# Patient Record
Sex: Male | Born: 1945 | Race: White | Hispanic: No | Smoking: Former smoker
Health system: Southern US, Community
[De-identification: ages and names within clinical notes are randomized; demographics above are authoritative.]

## PROBLEM LIST (undated history)

## (undated) DIAGNOSIS — K219 Gastro-esophageal reflux disease without esophagitis: Secondary | ICD-10-CM

## (undated) DIAGNOSIS — I10 Essential (primary) hypertension: Secondary | ICD-10-CM

## (undated) DIAGNOSIS — I509 Heart failure, unspecified: Secondary | ICD-10-CM

## (undated) DIAGNOSIS — I2510 Atherosclerotic heart disease of native coronary artery without angina pectoris: Secondary | ICD-10-CM

## (undated) DIAGNOSIS — R519 Headache, unspecified: Secondary | ICD-10-CM

## (undated) DIAGNOSIS — E119 Type 2 diabetes mellitus without complications: Secondary | ICD-10-CM

## (undated) DIAGNOSIS — R51 Headache: Secondary | ICD-10-CM

## (undated) HISTORY — PX: APPENDECTOMY: SHX54

## (undated) HISTORY — PX: OTHER SURGICAL HISTORY: SHX169

## (undated) HISTORY — PX: EYE SURGERY: SHX253

## (undated) HISTORY — PX: COLONOSCOPY: SHX174

## (undated) HISTORY — PX: CORONARY ARTERY BYPASS GRAFT: SHX141

---

## 2008-11-04 ENCOUNTER — Ambulatory Visit: Payer: Self-pay | Admitting: Vascular Surgery

## 2008-11-04 ENCOUNTER — Ambulatory Visit: Payer: Self-pay | Admitting: Family Medicine

## 2008-11-09 ENCOUNTER — Inpatient Hospital Stay: Payer: Self-pay | Admitting: Surgery

## 2009-01-08 ENCOUNTER — Inpatient Hospital Stay: Payer: Self-pay | Admitting: Surgery

## 2009-04-18 ENCOUNTER — Ambulatory Visit: Payer: Self-pay | Admitting: Unknown Physician Specialty

## 2009-05-03 ENCOUNTER — Ambulatory Visit: Payer: Self-pay | Admitting: Unknown Physician Specialty

## 2009-05-10 ENCOUNTER — Ambulatory Visit: Payer: Self-pay | Admitting: Unknown Physician Specialty

## 2009-06-15 ENCOUNTER — Encounter: Payer: Self-pay | Admitting: Unknown Physician Specialty

## 2009-07-08 ENCOUNTER — Encounter: Payer: Self-pay | Admitting: Unknown Physician Specialty

## 2009-08-25 ENCOUNTER — Ambulatory Visit: Payer: Self-pay | Admitting: Urology

## 2009-09-07 ENCOUNTER — Ambulatory Visit: Payer: Self-pay | Admitting: Ophthalmology

## 2009-09-20 ENCOUNTER — Ambulatory Visit: Payer: Self-pay | Admitting: Ophthalmology

## 2009-09-23 ENCOUNTER — Ambulatory Visit: Payer: Self-pay | Admitting: Unknown Physician Specialty

## 2011-11-19 ENCOUNTER — Inpatient Hospital Stay: Payer: Self-pay | Admitting: Internal Medicine

## 2011-11-19 LAB — URINALYSIS, COMPLETE
Glucose,UR: NEGATIVE mg/dL (ref 0–75)
Ketone: NEGATIVE
Nitrite: NEGATIVE
Protein: 100
RBC,UR: 3 /HPF (ref 0–5)
Specific Gravity: 1.025 (ref 1.003–1.030)
Squamous Epithelial: <1
Transitional Epi: 1
WBC UR: 29 /HPF (ref 0–5)

## 2011-11-19 LAB — COMPREHENSIVE METABOLIC PANEL
Anion Gap: 9 (ref 7–16)
Calcium, Total: 9.1 mg/dL (ref 8.5–10.1)
Chloride: 98 mmol/L (ref 98–107)
Co2: 26 mmol/L (ref 21–32)
Creatinine: 1.39 mg/dL — ABNORMAL HIGH (ref 0.60–1.30)
EGFR (African American): >60
EGFR (Non-African Amer.): 54 — ABNORMAL LOW
Glucose: 148 mg/dL — ABNORMAL HIGH (ref 65–99)
Osmolality: 272 (ref 275–301)
Potassium: 2.9 mmol/L — ABNORMAL LOW (ref 3.5–5.1)
Sodium: 133 mmol/L — ABNORMAL LOW (ref 136–145)

## 2011-11-19 LAB — CBC
HGB: 14.3 g/dL (ref 13.0–18.0)
MCH: 30.8 pg (ref 26.0–34.0)
MCHC: 34 g/dL (ref 32.0–36.0)
MCV: 91 fL (ref 80–100)
RBC: 4.63 10*6/uL (ref 4.40–5.90)

## 2011-11-20 LAB — TROPONIN I
Troponin-I: 0.13 ng/mL — ABNORMAL HIGH
Troponin-I: 0.08 ng/mL — ABNORMAL HIGH

## 2011-11-20 LAB — BASIC METABOLIC PANEL
Anion Gap: 10 (ref 7–16)
Calcium, Total: 8.6 mg/dL (ref 8.5–10.1)
Chloride: 99 mmol/L (ref 98–107)
Co2: 27 mmol/L (ref 21–32)
Creatinine: 1.36 mg/dL — ABNORMAL HIGH (ref 0.60–1.30)
EGFR (Non-African Amer.): 56 — ABNORMAL LOW
Glucose: 147 mg/dL — ABNORMAL HIGH (ref 65–99)
Osmolality: 278 (ref 275–301)
Potassium: 3.1 mmol/L — ABNORMAL LOW (ref 3.5–5.1)
Sodium: 136 mmol/L (ref 136–145)

## 2011-11-20 LAB — CBC WITH DIFFERENTIAL/PLATELET
Basophil #: 0 10*3/uL (ref 0.0–0.1)
Eosinophil #: 0 10*3/uL (ref 0.0–0.7)
Eosinophil %: 0 %
HGB: 13.9 g/dL (ref 13.0–18.0)
Lymphocyte #: 0.4 10*3/uL — ABNORMAL LOW (ref 1.0–3.6)
Lymphocyte %: 3.9 %
MCH: 30.9 pg (ref 26.0–34.0)
MCHC: 34.2 g/dL (ref 32.0–36.0)
MCV: 90 fL (ref 80–100)
Monocyte #: 0.6 10*3/uL (ref 0.0–0.7)
Neutrophil %: 90.4 %
Platelet: 116 10*3/uL — ABNORMAL LOW (ref 150–440)
RDW: 13.7 % (ref 11.5–14.5)

## 2011-11-20 LAB — LIPID PANEL
Cholesterol: 102 mg/dL (ref 0–200)
Triglycerides: 166 mg/dL (ref 0–200)

## 2011-11-20 LAB — MAGNESIUM: Magnesium: 1.5 mg/dL — ABNORMAL LOW

## 2011-11-20 LAB — APTT
Activated PTT: 34.4 secs (ref 23.6–35.9)
Activated PTT: 72.5 secs — ABNORMAL HIGH (ref 23.6–35.9)

## 2011-11-21 LAB — BASIC METABOLIC PANEL
Anion Gap: 9 (ref 7–16)
Calcium, Total: 8.3 mg/dL — ABNORMAL LOW (ref 8.5–10.1)
Chloride: 100 mmol/L (ref 98–107)
Co2: 29 mmol/L (ref 21–32)
Creatinine: 0.95 mg/dL (ref 0.60–1.30)

## 2011-11-21 LAB — HEMOGLOBIN A1C: Hemoglobin A1C: 6.8 % — ABNORMAL HIGH (ref 4.2–6.3)

## 2011-11-21 LAB — CBC WITH DIFFERENTIAL/PLATELET
Basophil %: 0.2 %
HGB: 12.6 g/dL — ABNORMAL LOW (ref 13.0–18.0)
Lymphocyte %: 17.3 %
MCV: 91 fL (ref 80–100)
Monocyte %: 10.1 %
Neutrophil #: 4 10*3/uL (ref 1.4–6.5)
Platelet: 102 10*3/uL — ABNORMAL LOW (ref 150–440)
RBC: 4.15 10*6/uL — ABNORMAL LOW (ref 4.40–5.90)
RDW: 13.9 % (ref 11.5–14.5)
WBC: 5.5 10*3/uL (ref 3.8–10.6)

## 2011-11-21 LAB — TROPONIN I: Troponin-I: 0.06 ng/mL — ABNORMAL HIGH

## 2011-11-21 LAB — APTT: Activated PTT: 86.5 secs — ABNORMAL HIGH (ref 23.6–35.9)

## 2011-11-22 LAB — TROPONIN I: Troponin-I: <0.02 ng/mL

## 2011-11-25 LAB — CULTURE, BLOOD (SINGLE)

## 2012-06-20 ENCOUNTER — Ambulatory Visit: Payer: Self-pay | Admitting: Unknown Physician Specialty

## 2012-08-05 ENCOUNTER — Other Ambulatory Visit: Payer: Self-pay | Admitting: Unknown Physician Specialty

## 2012-08-06 LAB — CLOSTRIDIUM DIFFICILE BY PCR

## 2013-08-27 ENCOUNTER — Emergency Department: Payer: Self-pay | Admitting: Emergency Medicine

## 2013-08-27 LAB — CBC WITH DIFFERENTIAL/PLATELET
Basophil #: 0.1 10*3/uL (ref 0.0–0.1)
Eosinophil %: 2.9 %
HGB: 14.9 g/dL (ref 13.0–18.0)
Lymphocyte %: 34.7 %
Monocyte %: 9.6 %
Neutrophil #: 3.9 10*3/uL (ref 1.4–6.5)
Platelet: 170 10*3/uL (ref 150–440)
RDW: 13.5 % (ref 11.5–14.5)

## 2013-08-27 LAB — COMPREHENSIVE METABOLIC PANEL
Albumin: 3.8 g/dL (ref 3.4–5.0)
Bilirubin,Total: 0.3 mg/dL (ref 0.2–1.0)
EGFR (African American): >60
EGFR (Non-African Amer.): >60
Glucose: 129 mg/dL — ABNORMAL HIGH (ref 65–99)
Osmolality: 276 (ref 275–301)
Potassium: 3.7 mmol/L (ref 3.5–5.1)
SGPT (ALT): 77 U/L (ref 12–78)
Sodium: 137 mmol/L (ref 136–145)
Total Protein: 6.9 g/dL (ref 6.4–8.2)

## 2013-08-27 LAB — URINALYSIS, COMPLETE
Bilirubin,UR: NEGATIVE
Leukocyte Esterase: NEGATIVE
Ph: 5 (ref 4.5–8.0)
RBC,UR: NONE SEEN /HPF (ref 0–5)
Squamous Epithelial: NONE SEEN

## 2013-08-27 LAB — TROPONIN I: Troponin-I: <0.02 ng/mL

## 2013-08-27 LAB — CK TOTAL AND CKMB (NOT AT ARMC): CK, Total: 129 U/L (ref 35–232)

## 2013-11-20 ENCOUNTER — Ambulatory Visit: Payer: Self-pay | Admitting: Family Medicine

## 2013-11-20 LAB — CREATININE, SERUM
CREATININE: 0.93 mg/dL (ref 0.60–1.30)
EGFR (African American): >60
EGFR (Non-African Amer.): >60

## 2014-05-16 ENCOUNTER — Emergency Department: Payer: Self-pay | Admitting: Emergency Medicine

## 2014-05-16 LAB — BODY FLUID CELL COUNT WITH DIFFERENTIAL
BASOS ABS: 0 %
EOS PCT: 0 %
Lymphocytes: 22 %
Neutrophils: 26 %
Nucleated Cell Count: 1322 /mm3
Other Cells BF: 0 %
Other Mononuclear Cells: 52 %

## 2014-05-16 LAB — SYNOVIAL FLUID, CRYSTAL: CRYSTALS, JOINT FLUID: NONE SEEN

## 2014-05-24 ENCOUNTER — Ambulatory Visit: Payer: Self-pay | Admitting: Specialist

## 2014-05-24 DIAGNOSIS — I2510 Atherosclerotic heart disease of native coronary artery without angina pectoris: Secondary | ICD-10-CM

## 2014-05-24 LAB — BASIC METABOLIC PANEL
ANION GAP: 10 (ref 7–16)
BUN: 17 mg/dL (ref 7–18)
CALCIUM: 9.1 mg/dL (ref 8.5–10.1)
Chloride: 103 mmol/L (ref 98–107)
Co2: 27 mmol/L (ref 21–32)
Creatinine: 1.05 mg/dL (ref 0.60–1.30)
EGFR (Non-African Amer.): >60
GLUCOSE: 115 mg/dL — AB (ref 65–99)
Osmolality: 282 (ref 275–301)
Potassium: 4 mmol/L (ref 3.5–5.1)
SODIUM: 140 mmol/L (ref 136–145)

## 2014-05-24 LAB — CBC WITH DIFFERENTIAL/PLATELET
BASOS PCT: 0.4 %
Basophil #: 0 10*3/uL (ref 0.0–0.1)
EOS ABS: 0.2 10*3/uL (ref 0.0–0.7)
Eosinophil %: 1.7 %
HCT: 44.2 % (ref 40.0–52.0)
HGB: 14.6 g/dL (ref 13.0–18.0)
Lymphocyte #: 2.6 10*3/uL (ref 1.0–3.6)
Lymphocyte %: 27.7 %
MCH: 30.6 pg (ref 26.0–34.0)
MCHC: 33.1 g/dL (ref 32.0–36.0)
MCV: 92 fL (ref 80–100)
Monocyte #: 1 x10 3/mm (ref 0.2–1.0)
Monocyte %: 10.8 %
NEUTROS PCT: 59.4 %
Neutrophil #: 5.6 10*3/uL (ref 1.4–6.5)
Platelet: 216 10*3/uL (ref 150–440)
RBC: 4.78 10*6/uL (ref 4.40–5.90)
RDW: 14 % (ref 11.5–14.5)
WBC: 9.3 10*3/uL (ref 3.8–10.6)

## 2014-05-26 DIAGNOSIS — I10 Essential (primary) hypertension: Principal | ICD-10-CM

## 2014-05-26 DIAGNOSIS — E1159 Type 2 diabetes mellitus with other circulatory complications: Secondary | ICD-10-CM | POA: Insufficient documentation

## 2014-05-28 ENCOUNTER — Ambulatory Visit: Payer: Self-pay | Admitting: Specialist

## 2014-08-18 ENCOUNTER — Emergency Department: Payer: Self-pay | Admitting: Emergency Medicine

## 2014-08-18 LAB — URINALYSIS, COMPLETE
BACTERIA: NONE SEEN
Bilirubin,UR: NEGATIVE
Blood: NEGATIVE
GLUCOSE, UR: NEGATIVE mg/dL (ref 0–75)
Ketone: NEGATIVE
Leukocyte Esterase: NEGATIVE
Nitrite: NEGATIVE
PH: 5 (ref 4.5–8.0)
Protein: NEGATIVE
RBC,UR: <1 /HPF (ref 0–5)
Specific Gravity: 1.012 (ref 1.003–1.030)
Squamous Epithelial: <1

## 2014-08-18 LAB — CBC
HCT: 43 % (ref 40.0–52.0)
HGB: 14.5 g/dL (ref 13.0–18.0)
MCH: 31.1 pg (ref 26.0–34.0)
MCHC: 33.7 g/dL (ref 32.0–36.0)
MCV: 92 fL (ref 80–100)
Platelet: 199 10*3/uL (ref 150–440)
RBC: 4.65 10*6/uL (ref 4.40–5.90)
RDW: 13.6 % (ref 11.5–14.5)
WBC: 6.2 10*3/uL (ref 3.8–10.6)

## 2014-08-18 LAB — COMPREHENSIVE METABOLIC PANEL
ALBUMIN: 3.8 g/dL (ref 3.4–5.0)
ALK PHOS: 51 U/L
ALT: 45 U/L
ANION GAP: 7 (ref 7–16)
AST: 20 U/L (ref 15–37)
BILIRUBIN TOTAL: 0.5 mg/dL (ref 0.2–1.0)
BUN: 14 mg/dL (ref 7–18)
CALCIUM: 9.1 mg/dL (ref 8.5–10.1)
Chloride: 101 mmol/L (ref 98–107)
Co2: 29 mmol/L (ref 21–32)
Creatinine: 1.03 mg/dL (ref 0.60–1.30)
Glucose: 125 mg/dL — ABNORMAL HIGH (ref 65–99)
Osmolality: 276 (ref 275–301)
Potassium: 3.6 mmol/L (ref 3.5–5.1)
SODIUM: 137 mmol/L (ref 136–145)
Total Protein: 6.9 g/dL (ref 6.4–8.2)

## 2014-08-18 LAB — LIPASE, BLOOD: Lipase: 159 U/L (ref 73–393)

## 2015-01-29 NOTE — Op Note (Signed)
PATIENT NAME:  Derek Merritt, ZOLLNER MR#:  [id number] DATE OF BIRTH:  [birth year]  DATE OF PROCEDURE:  05/28/2014  PREOPERATIVE DIAGNOSES: 1.  Medial meniscus tear, right knee.  2.  Degenerative arthritis, medial compartment, right knee.   POSTOPERATIVE DIAGNOSES:  1.  Medial meniscus tear, right knee.  2.  Degenerative arthritis, medial compartment, right knee.  3.  Large loose body.  PROCEDURE PERFORMED: Arthroscopic partial medial meniscectomy.   SURGEON: Christophe Louis, M.D.   ANESTHESIA: General.   COMPLICATIONS: None.   DESCRIPTION OF PROCEDURE: After adequate induction of general anesthesia, the right lower extremity is placed in the legholder in the usual manner for arthroscopy. The joint is thoroughly prepped with alcohol and ChloraPrep and draped in standard sterile fashion. The joint is infiltrated with 10 mL of Marcaine with epinephrine. Diagnostic arthroscopy is performed. There is moderate synovial hypertrophy in the suprapatellar pouch. 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 In the intercondylar notch, the anterior cruciate ligament is normal. The lateral compartment is within normal limits. There is some fraying of the edge of the meniscus and the ArthroWand is used to cut this back to a stable rim. The pathology is confined to the medial compartment. 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 Using a combination of the full radial resector and the ArthroWand, the torn portion of the meniscus is resected back to a stable rim. Inflammatory tissue in the medial gutter is completely removed. The awl is then used to create 3 separate holes in the area of chondromalacia, in the medial femoral condyle, as a microfracture technique. There is seen to be a 1 cm in diameter loose  body which appeared and which was removed with the pituitary rongeur. The joint is thoroughly irrigated multiple times. Skin edges are closed with 4-0 nylon. The joint is infiltrated with 15 mL of Marcaine with epinephrine and 4 mg of morphine. A soft bulky dressing is applied. The patient is returned to the recovery room in satisfactory condition having tolerated the procedure quite well.  ____________________________ Lucas Mallow, MD ces:sb D: 05/28/2014 13:40:08 ET T: 05/28/2014 15:14:14 ET JOB#: [id number]  cc: Lucas Mallow, MD, <Dictator> Lucas Mallow MD ELECTRONICALLY SIGNED 06/02/2014 16:49

## 2015-01-30 NOTE — Consult Note (Signed)
Present Illness 69 year old male with known coronary disease with previous minimal myocardial infarction, hypertension, hyperlipidemia, and has had an abnormal EKG in the past.  The patient recently has done well from a cardiac standpoint without evidence of significant new shortness of breath.  He did have some significant weight gain which could cause some shortness of breath that has had recently.  He has had the new onset urinary tract infection type symptoms for which is caused him some weakness and dizziness.  This is caused him to come to the hospital.  At that time.  He does have some blood pressure issues were placed on appropriate medications including metoprolol, hydrochlorothiazide.  The patient also has had good control of his lipids, on simvastatin.  He does have some chronic lower extremity edema, possibly secondary to his significant weight gain which is currently regulated by furosemide.  Upon admission, the patient did have an EKG showing normal sinus rhythm with septal infarct, age undetermined.  In addition, he had a troponin level of 0.1 more consistent with current illness rather than acute myocardial infarction  Family history No family members with early onset cardiovascular disease  Social history Patient currently denies alcohol or tobacco use   Physical Exam:   GEN WD    HEENT pink conjunctivae    NECK No masses    RESP clear BS    CARD Irregular rate and rhythm    ABD denies tenderness  soft    LYMPH negative neck    EXTR negative cyanosis/clubbing    SKIN No rashes    NEURO cranial nerves intact    PSYCH alert   Review of Systems:   Subjective/Chief Complaint I have bladder issues    Review of Systems: All other systems were reviewed and found to be negative    Medications/Allergies Reviewed Medications/Allergies reviewed     cataract surgery: Both eyes   GERD - Esophageal Reflux:    HTN:    Carpal Tunnel:    Spinal Fusion:    CABG  III:    appendectomy:   Home Medications: Medication Instructions Status  MiraLax oral powder for reconstitution    as needed   Active  metoprolol tartrate 50 mg oral tablet 1 tab(s) orally once a day and 1/2  tablet at bedtime Active  glycopyrrolate 1 mg oral tablet 2 tab(s) orally 3 times a day  Active  aspirin 325 mg oral enteric coated tablet 1 tab(s) orally once a day  Active  Multiple Vitamins oral capsule 1 cap(s) orally once a day  Active  furosemide 40 mg oral tablet 1 tab(s) orally once a day as needed   Active  simvastatin tablet 10 mg 1 tab(s) orally once a day (at bedtime)  Active  gemfibrozil tablet 600 mg 1 tab(s) orally 2 times a day  Active  omeprazole enteric coated tablet 20 mg 1 tab(s) orally 2 times a day  Active  hydrochlorothiazide tablet 25 mg 1 tab(s) orally once a day  Active   Routine Hem:  11-Feb-13 19:43    WBC (CBC) 10.8   RBC (CBC) 4.63   Hemoglobin (CBC) 14.3   Hematocrit (CBC) 42.0   Platelet Count (CBC) 127   MCV 91   MCH 30.8   MCHC 34.0   RDW 13.3  Routine Chem:  11-Feb-13 19:43    Glucose, Serum 148   BUN 20   Creatinine (comp) 1.39   Sodium, Serum 133   Potassium, Serum 2.9   Chloride, Serum  98   CO2, Serum 26   Calcium (Total), Serum 9.1  Hepatic:  11-Feb-13 19:43    Bilirubin, Total 1.1   Alkaline Phosphatase 41   SGPT (ALT) 55   SGOT (AST) 45   Total Protein, Serum 7.3   Albumin, Serum 3.7  Routine Chem:  11-Feb-13 19:43    Osmolality (calc) 272   eGFR (African American) >60   eGFR (Non-African American) 54   Anion Gap 9  Cardiac:  11-Feb-13 19:43    Troponin I 0.07  Cardiology:  11-Feb-13 19:52    Ventricular Rate 84   Atrial Rate 84   P-R Interval 192   QRS Duration 88   QT 398   QTc 470   P Axis 11   R Axis -12   T Axis 115  Blood Glucose:  11-Feb-13 19:56    POCT Blood Glucose 163  Routine UA:  11-Feb-13 20:47    Color (UA) Amber   Clarity (UA) Cloudy   Glucose (UA) Negative   Bilirubin (UA)  Negative   Ketones (UA) Negative   Specific Gravity (UA) 1.025   Blood (UA) 3+   pH (UA) 5.0   Nitrite (UA) Negative   Leukocyte Esterase (UA) Trace   RBC (UA) 3 /HPF   WBC (UA) 29 /HPF   Bacteria (UA) TRACE   Epithelial Cells (UA) <1 /HPF   Transitional Epithelial (UA) 1 /HPF   WBC Clump (UA) PRESENT   Mucous (UA) PRESENT   Hyaline Cast (UA) 17 /LPF   Cellular Cast (UA) 7 /LPF  Routine Micro:  11-Feb-13 20:47    Specimen Source CC  Routine Hem:  12-Feb-13 03:29    WBC (CBC) 10.2   RBC (CBC) 4.50   Hemoglobin (CBC) 13.9   Hematocrit (CBC) 40.6   Platelet Count (CBC) 116   MCV 90   MCH 30.9   MCHC 34.2   RDW 13.7  Routine Chem:  12-Feb-13 03:29    Glucose, Serum 147   BUN 22   Creatinine (comp) 1.36   Sodium, Serum 136   Potassium, Serum 3.1   Chloride, Serum 99   CO2, Serum 27   Calcium (Total), Serum 8.6   Osmolality (calc) 278   eGFR (African American) >60   eGFR (Non-African American) 56   Anion Gap 10  Cardiac:  12-Feb-13 03:29    Troponin I 0.13  Routine Hem:  12-Feb-13 03:29    Neutrophil % 90.4   Lymphocyte % 3.9   Monocyte % 5.7   Eosinophil % 0.0   Basophil % 0.0   Neutrophil # 9.2   Lymphocyte # 0.4   Monocyte # 0.6   Eosinophil # 0.0   Basophil # 0.0  Routine Chem:  12-Feb-13 03:29    Magnesium, Serum 1.5   Cholesterol, Serum 102   Triglycerides, Serum 166   HDL (INHOUSE) 8   VLDL Cholesterol Calculated 33   LDL Cholesterol Calculated 61  Cardiac:  12-Feb-13 11:41    Troponin I 0.10  Routine Coag:  12-Feb-13 12:58    Activated PTT (APTT) 34.4   EKG:   EKG Interp. by me    Interpretation normal sinus rhythm with septal infarct, age undetermined    No Known Allergies:   Vital Signs/Nurse's Notes: **Vital Signs.:   12-Feb-13 00:39   Vital Signs Type Admission   Temperature Temperature (F) 99.8   Celsius 37.6   Pulse Pulse 87   Pulse source per Dinamap   Respirations Respirations 20  Systolic BP Systolic BP 257    Diastolic BP (mmHg) Diastolic BP (mmHg) 72   Mean BP 92   BP Source Dinamap   Pulse Ox % Pulse Ox % 92   Pulse Ox Activity Level  At rest   Oxygen Delivery Room Air/ 21 %     Impression 69 year old male with known coronary artery disease status post coronary artery bypass graft, hypertension, hyperlipidemia, abnormal EKG with admission for urinary tract and/or abdominal discomfort with elevated troponin and abnormal EKG, most consistent with current illness rather than acute myocardial infarction.  This is suggestive of demand ischemia    Plan 1.  Continue current outpatient medications for hypertension control, including hydrochlorothiazide and metoprolol. 2.  Continue simvastatin.  The goal LDL below 70 mg/dL   3.  Consider echocardiogram if necessary for LV systolic dysfunction, valvular heart disease from previous diagnosis of coronary artery disease and further treatment options as necessary. 4.  Further workup and treatment of recurrent urologic symptoms with procedures as necessary.  Patient is at low risk for cardiovascular complications. 5.  Ambulate and adjustments of medications as necessary   Electronic Signatures: Corey Skains (MD)  (Signed 12-Feb-13 17:03)  Authored: General Aspect/Present Illness, History and Physical Exam, Review of System, Past Medical History, Home Medications, Labs, EKG , Allergies, Vital Signs/Nurse's Notes, Impression/Plan   Last Updated: 12-Feb-13 17:03 by Corey Skains (MD)

## 2015-01-30 NOTE — H&P (Signed)
PATIENT NAME:  Derek Merritt, Derek Merritt MR#:  [id number] DATE OF BIRTH:  [birth year]  DATE OF ADMISSION:  11/19/2011  REFERRING PHYSICIAN: Francene Castle, MD     PRIMARY CARE PHYSICIAN: Fonnie Jarvis. Ilene Qua, MD   CHIEF COMPLAINT: Urinary tract infection and weakness for a couple of days.   HISTORY OF PRESENT ILLNESS: The patient is a 69 year old Caucasian male with a history of coronary artery disease, status post CABG, urinary tract infection, hypertension, presented in the ED with the above chief complaint. The patient is alert, awake, oriented and in no acute distress. The patient said he has had dysuria and urinary urgency for several days. He went to [facility] and was found to have a urinary tract infection. He was treated with Levaquin IV one dose and given a p.o. medication and was sent to home; but he feels sick and weak, so he came to the ED for further evaluation. The patient was noted to have an elevated troponin at 0.07 in the ED. Dr. Thomasene Lot admitted the patient for elevated troponin.  PAST MEDICAL HISTORY:  1. Coronary artery disease.  2. Urinary tract infection. 3. Hypertension.  PAST SURGICAL HISTORY:  1. CABG. 2. Spine fusion.   SOCIAL HISTORY: He smokes electronic cigarettes, but no alcohol drinking or illicit drugs.   FAMILY HISTORY: Hypertension.   ALLERGIES: No known drug allergies.   MEDICATIONS:  1. Aspirin 325 mg p.o. daily.  2. Lasix 40 mg p.o. once daily p.r.n.  3. Gemfibrozil 600 mg p.o. b.i.d.  4. Glycopyrrolate 1 mg, 2 tablets p.o. t.i.d.  5. HCTZ 25 mg p.o. daily.  6. Lopressor 50 mg, 1 tab once daily and 1/2 tablet at bedtime.  7. MiraLax p.r.n. for constipation.  8. Multivitamins p.o. once daily.  9. Omeprazole 20 mg p.o. b.i.d.  10. Zocor 10 mg p.o. at bedtime.   REVIEW OF SYSTEMS: CONSTITUTIONAL: The patient has a fever of 102 degrees, comes in for fatigue, weakness. EYES: No blurred vision, double vision, no glaucoma. ENT: No hearing loss, epistaxis or  postnasal drip. RESPIRATORY: No cough, sputum, shortness of breath. No hemoptysis. CARDIOVASCULAR: No chest pain, palpitations, orthopnea. No nocturnal dyspnea. No leg edema. GI: No abdominal pain, nausea, vomiting, or diarrhea. No melena or bloody stool. GU: Positive for dysuria, urinary frequency, but no incontinence or hematuria. ENDOCRINE: No polyuria, polydipsia. No heat or cold tolerance. HEMATOLOGY: No easy bruising or bleeding. SKIN: No rash or jaundice. NEUROLOGY: No syncope, loss of consciousness or seizure but has mild lightheadedness. PSYCHIATRIC: No depression or anxiety.   PHYSICAL EXAMINATION:  VITAL SIGNS: Temperature 102.7, then decreased to 99.6, blood pressure 107/53, pulse 76, respirations 23.   GENERAL: The patient is alert, awake, oriented, in no acute distress.   HEENT: Pupils are round, equal, and reactive to light and accommodation. Moist oral mucosa. Clear oropharynx.   NECK: Supple. No JVD or carotid bruit. No lymphadenopathy. No thyromegaly.   CARDIOVASCULAR: S1, S2 regular rate and rhythm. No murmurs or gallops. No tenderness. There is a surgical scar in the middle of the chest.   PULMONARY: Bilateral air entry. No wheezing. No rales.   ABDOMEN: Soft, obese. Bowel sounds are present. No organomegaly.   EXTREMITIES: No edema, clubbing, or cyanosis. No calf tenderness. Strong bilateral pedal pulses.   SKIN: No rash or jaundice.   NEUROLOGY: Alert and oriented x3.  No focal deficit. Power five out of five. Sensation intact. Deep tendon reflexes 2+.   LABORATORY, DIAGNOSTIC AND RADIOLOGICAL DATA:  Urinalysis shows WBC  29, RBC 3, nitrite negative.  WBC 10.8, hemoglobin 14.3, platelets 127.  Glucose 148, BUN 20, creatinine 1.39, sodium 133, potassium 3.9, chloride 98. Troponin level 0.07.  EKG showed normal sinus rhythm at 84 beats per minute.   IMPRESSION:  1. Elevated troponin, need to rule out ACS.  2. Coronary artery disease.  3. Urinary tract infection.   4. Renal insufficiency.  5. Hyponatremia.  6. Hypokalemia.  7. Hypertension.  8. Obesity.   PLAN OF TREATMENT:  1. The patient will be placed for observation. We will continue telemonitor, O2 by nasal cannula, follow up troponin level x2.  2. We will continue aspirin 325 mg p.o. daily, continue Lopressor and Lasix.  3. For urinary tract infection, we will give Rocephin IV and follow up with blood culture and urine culture.  4. For hyponatremia and renal insufficiency, we will give normal saline IV fluid and a follow-up BMP.  5. For hypokalemia, we will give potassium and follow-up BMP and magnesium level.  6. GI and deep vein thrombosis prophylaxis.   I discussed the patient's situation and the plan of treatment with the patient.    TIME SPENT: About 60 minutes   ____________________________ Demetrios Loll, MD qc:cbb D: 11/19/2011 23:01:42 ET T: 11/20/2011 07:20:23 ET JOB#: [id number]  cc: Demetrios Loll, MD, <Dictator> Fonnie Jarvis. Ilene Qua, MD Demetrios Loll MD ELECTRONICALLY SIGNED 11/21/2011 17:06

## 2015-01-30 NOTE — Discharge Summary (Signed)
PATIENT NAME:  Derek Merritt, Derek Merritt MR#:  [id number] DATE OF BIRTH:  1946-01-10  DATE OF ADMISSION:  11/19/2011 DATE OF DISCHARGE:  11/22/2011  PRIMARY CARE PHYSICIAN:  Dr. Domenick Gong.   CARDIOLOGY: Dr. Nehemiah Massed.   DISCHARGE DIAGNOSES: 1. Elevated troponin, likely due to supply/demand ischemia.  2. Hyponatremia/hypokalemia/hypomagnesemia, repleted and resolved.  3. Urinary tract infection, now treated. Urine culture remained negative.  4. Acute renal insufficiency, likely prerenal, improved with hydration.   SECONDARY DIAGNOSES:  1. Coronary artery disease. 2. Hypertension.   CONSULTATION: Cardiology, Dr. Nehemiah Massed.   PROCEDURE/RADIOLOGY:  1. Chest x-ray on 02/11 showed no acute cardiopulmonary disease.  2. 2-D echocardiogram on 02/13 showed low normal LV function, ejection fraction of 50%. Left ventricular hypertrophy. Mild mitral and tricuspid regurgitation.   MAJOR LABORATORY PANEL: Urinalysis on 02/11 showed WBC in clumps, trace bacteria, 29 WBCs, trace leukocyte esterase. Blood cultures x2 were negative. Urine cultures remained negative on 02/11.   HISTORY AND SHORT HOSPITAL COURSE: The patient is a 69 year old male with above-mentioned medical problems who was admitted for elevated troponin and feeling weak. He was found to have possible urinary tract infection based on urinalysis and was treated with IV Rocephin. His urine culture remained negative. His weakness was slowly improving with hydration. Considering his elevated troponin, cardiology consult was obtained with Dr. Nehemiah Massed who recommended 2-D echocardiogram. His troponin peaked at 0.13 and this was thought to be due to supply demand ischemia, possibly due to infection and/or underlying minimal renal insufficiency. The patient was slowly improving. Dr. Nehemiah Massed did not recommend any further treatment. He also had significant electrolyte disturbances including hyponatremia, hypokalemia, and hypomagnesemia, which was aggressively  repleted and resolved. He is feeling much better and is being discharged home in stable condition.   PERTINENT PHYSICAL EXAMINATION: VITAL SIGNS: On the date of discharge, his vital signs are as follows: Temperature 98, heart rate 60 per minute, respirations 20 per minute, blood pressure 146/81. He is saturating 94% on room air.  CARDIOVASCULAR: S1, S2 normal. No murmur, rubs, or gallop. LUNGS: Clear to auscultation bilaterally. No wheezing, rales, rhonchi, or crepitation. ABDOMEN: Soft, obese, benign. NEUROLOGIC: Nonfocal examination. All other physical examination remained at baseline.   DISCHARGE MEDICATIONS:  1. MiraLAX as needed.  2. Metoprolol 50 mg p.o. daily in the morning and half-tablet at bedtime.  3. Glycopyrrolate 1 mg 2 tablets p.o. 3 times daily.  4. Aspirin 325 mg p.o. daily.  5. Multivitamin 1 capsule p.o. daily.  6. Lasix 40 mg p.o. daily as needed.  7. Zocor 10 mg p.o. at bedtime. 8. Gemfibrozil 600 mg p.o. b.i.d.  9. Omeprazole 20 mg p.o. b.i.d.  10. Hydrochlorothiazide 25 mg p.o. daily.   DISCHARGE DIET: Low sodium, low cholesterol.   DISCHARGE ACTIVITY: As tolerated.   DISCHARGE INSTRUCTIONS AND FOLLOW-UP:  1. The patient was instructed to follow-up with his primary care physician, Dr. Domenick Gong, in 1 to 2 weeks.  2. He will need follow-up with Dr. Nehemiah Massed in 2 to 3 weeks.         TOTAL TIME DISCHARGING THIS PATIENT: 55 minutes.   ____________________________ Lucina Mellow. Manuella Ghazi, MD vss:ap D: 11/22/2011 10:56:30 ET             T: 11/22/2011 15:37:34 ET JOB#: [id number]  cc: Ameya Vowell S. Manuella Ghazi, MD, <Dictator> Fonnie Jarvis. Ilene Qua, MD Corey Skains, MD Remer Macho MD ELECTRONICALLY SIGNED 11/23/2011 22:41

## 2016-11-16 ENCOUNTER — Ambulatory Visit: Payer: Self-pay | Admitting: Cardiovascular Disease

## 2016-11-22 ENCOUNTER — Ambulatory Visit: Payer: Medicare Other | Admitting: Cardiovascular Disease

## 2017-03-18 ENCOUNTER — Encounter: Admission: RE | Payer: Self-pay | Source: Ambulatory Visit

## 2017-03-18 ENCOUNTER — Ambulatory Visit
Admission: RE | Admit: 2017-03-18 | Payer: Medicare Other | Source: Ambulatory Visit | Admitting: Unknown Physician Specialty

## 2017-03-18 SURGERY — ESOPHAGOGASTRODUODENOSCOPY (EGD) WITH PROPOFOL
Anesthesia: General

## 2017-04-26 ENCOUNTER — Encounter: Payer: Self-pay | Admitting: *Deleted

## 2017-04-29 ENCOUNTER — Encounter
Admission: RE | Disposition: A | Discharge status: Home / Self Care | Payer: Self-pay | Source: Ambulatory Visit | Attending: Gastroenterology

## 2017-04-29 ENCOUNTER — Ambulatory Visit
Admission: RE | Admit: 2017-04-29 | Discharge: 2017-04-29 | Disposition: A | Discharge status: Home / Self Care | Payer: Medicare Other | Source: Ambulatory Visit | Attending: Gastroenterology | Admitting: Gastroenterology

## 2017-04-29 ENCOUNTER — Encounter: Payer: Self-pay | Admitting: *Deleted

## 2017-04-29 ENCOUNTER — Ambulatory Visit: Payer: Medicare Other | Admitting: Anesthesiology

## 2017-04-29 DIAGNOSIS — Z1211 Encounter for screening for malignant neoplasm of colon: Secondary | ICD-10-CM | POA: Diagnosis not present

## 2017-04-29 DIAGNOSIS — Z888 Allergy status to other drugs, medicaments and biological substances status: Secondary | ICD-10-CM | POA: Insufficient documentation

## 2017-04-29 DIAGNOSIS — I5032 Chronic diastolic (congestive) heart failure: Secondary | ICD-10-CM | POA: Diagnosis not present

## 2017-04-29 DIAGNOSIS — I2510 Atherosclerotic heart disease of native coronary artery without angina pectoris: Secondary | ICD-10-CM | POA: Insufficient documentation

## 2017-04-29 DIAGNOSIS — K5730 Diverticulosis of large intestine without perforation or abscess without bleeding: Secondary | ICD-10-CM | POA: Insufficient documentation

## 2017-04-29 DIAGNOSIS — D123 Benign neoplasm of transverse colon: Secondary | ICD-10-CM | POA: Insufficient documentation

## 2017-04-29 DIAGNOSIS — Z79899 Other long term (current) drug therapy: Secondary | ICD-10-CM | POA: Diagnosis not present

## 2017-04-29 DIAGNOSIS — Z96652 Presence of left artificial knee joint: Secondary | ICD-10-CM | POA: Diagnosis not present

## 2017-04-29 DIAGNOSIS — Z7982 Long term (current) use of aspirin: Secondary | ICD-10-CM | POA: Diagnosis not present

## 2017-04-29 DIAGNOSIS — K2960 Other gastritis without bleeding: Secondary | ICD-10-CM | POA: Insufficient documentation

## 2017-04-29 DIAGNOSIS — E669 Obesity, unspecified: Secondary | ICD-10-CM | POA: Diagnosis not present

## 2017-04-29 DIAGNOSIS — Z7984 Long term (current) use of oral hypoglycemic drugs: Secondary | ICD-10-CM | POA: Diagnosis not present

## 2017-04-29 DIAGNOSIS — Z951 Presence of aortocoronary bypass graft: Secondary | ICD-10-CM | POA: Diagnosis not present

## 2017-04-29 DIAGNOSIS — D120 Benign neoplasm of cecum: Secondary | ICD-10-CM | POA: Diagnosis not present

## 2017-04-29 DIAGNOSIS — Z86010 Personal history of colonic polyps: Secondary | ICD-10-CM | POA: Insufficient documentation

## 2017-04-29 DIAGNOSIS — K640 First degree hemorrhoids: Secondary | ICD-10-CM | POA: Diagnosis not present

## 2017-04-29 DIAGNOSIS — K219 Gastro-esophageal reflux disease without esophagitis: Secondary | ICD-10-CM | POA: Diagnosis not present

## 2017-04-29 DIAGNOSIS — I110 Hypertensive heart disease with heart failure: Secondary | ICD-10-CM | POA: Insufficient documentation

## 2017-04-29 DIAGNOSIS — R51 Headache: Secondary | ICD-10-CM | POA: Diagnosis not present

## 2017-04-29 DIAGNOSIS — K317 Polyp of stomach and duodenum: Secondary | ICD-10-CM | POA: Diagnosis not present

## 2017-04-29 HISTORY — DX: Atherosclerotic heart disease of native coronary artery without angina pectoris: I25.10

## 2017-04-29 HISTORY — DX: Headache: R51

## 2017-04-29 HISTORY — PX: ESOPHAGOGASTRODUODENOSCOPY (EGD) WITH PROPOFOL: SHX5813

## 2017-04-29 HISTORY — DX: Gastro-esophageal reflux disease without esophagitis: K21.9

## 2017-04-29 HISTORY — DX: Essential (primary) hypertension: I10

## 2017-04-29 HISTORY — PX: COLONOSCOPY WITH PROPOFOL: SHX5780

## 2017-04-29 HISTORY — DX: Headache, unspecified: R51.9

## 2017-04-29 LAB — GLUCOSE, CAPILLARY: GLUCOSE-CAPILLARY: 125 mg/dL — AB (ref 65–99)

## 2017-04-29 SURGERY — COLONOSCOPY WITH PROPOFOL
Anesthesia: General

## 2017-04-29 MED ORDER — PROPOFOL 10 MG/ML IV BOLUS
INTRAVENOUS | Status: DC | PRN
  Administered 2017-04-29: 20 mg via INTRAVENOUS
  Administered 2017-04-29: 10 mg via INTRAVENOUS
  Administered 2017-04-29: 20 mg via INTRAVENOUS
  Administered 2017-04-29: 50 mg via INTRAVENOUS
  Administered 2017-04-29: 20 mg via INTRAVENOUS
  Administered 2017-04-29: 10 mg via INTRAVENOUS

## 2017-04-29 MED ORDER — FENTANYL CITRATE (PF) 100 MCG/2ML IJ SOLN
INTRAMUSCULAR | Status: AC
  Filled 2017-04-29: qty 2

## 2017-04-29 MED ORDER — EPHEDRINE SULFATE 50 MG/ML IJ SOLN
INTRAMUSCULAR | Status: AC
  Filled 2017-04-29: qty 1

## 2017-04-29 MED ORDER — SODIUM CHLORIDE 0.9 % IV SOLN: INTRAVENOUS | Status: DC

## 2017-04-29 MED ORDER — FENTANYL CITRATE (PF) 100 MCG/2ML IJ SOLN
INTRAMUSCULAR | Status: DC | PRN
  Administered 2017-04-29: 50 ug via INTRAVENOUS

## 2017-04-29 MED ORDER — SODIUM CHLORIDE 0.9 % IV SOLN
INTRAVENOUS | Status: AC
  Administered 2017-04-29: 2 g via INTRAVENOUS
  Filled 2017-04-29: qty 2000

## 2017-04-29 MED ORDER — MIDAZOLAM HCL 2 MG/2ML IJ SOLN
INTRAMUSCULAR | Status: DC | PRN
  Administered 2017-04-29: 1 mg via INTRAVENOUS

## 2017-04-29 MED ORDER — LIDOCAINE HCL (PF) 2 % IJ SOLN
INTRAMUSCULAR | Status: AC
  Filled 2017-04-29: qty 2

## 2017-04-29 MED ORDER — GLYCOPYRROLATE 0.2 MG/ML IJ SOLN
INTRAMUSCULAR | Status: DC | PRN
  Administered 2017-04-29: 0.2 mg via INTRAVENOUS

## 2017-04-29 MED ORDER — AMPICILLIN SODIUM 2 G IJ SOLR
2.0000 g | Freq: Once | INTRAMUSCULAR | Status: AC
  Administered 2017-04-29: 2 g via INTRAVENOUS

## 2017-04-29 MED ORDER — PROPOFOL 500 MG/50ML IV EMUL
INTRAVENOUS | Status: DC | PRN
  Administered 2017-04-29: 150 ug/kg/min via INTRAVENOUS

## 2017-04-29 MED ORDER — PROPOFOL 500 MG/50ML IV EMUL
INTRAVENOUS | Status: AC
  Filled 2017-04-29: qty 50

## 2017-04-29 MED ORDER — SODIUM CHLORIDE 0.9 % IV SOLN
INTRAVENOUS | Status: DC
  Administered 2017-04-29 (×2): via INTRAVENOUS

## 2017-04-29 MED ORDER — MIDAZOLAM HCL 2 MG/2ML IJ SOLN
INTRAMUSCULAR | Status: AC
  Filled 2017-04-29: qty 2

## 2017-04-29 MED ORDER — EPHEDRINE SULFATE 50 MG/ML IJ SOLN
INTRAMUSCULAR | Status: DC | PRN
  Administered 2017-04-29: 10 mg via INTRAVENOUS

## 2017-04-29 NOTE — Anesthesia Postprocedure Evaluation (Signed)
Anesthesia Post Note  Patient: STEFFON GLADU  Procedure(s) Performed: Procedure(s) (LRB): COLONOSCOPY WITH PROPOFOL (N/A) ESOPHAGOGASTRODUODENOSCOPY (EGD) WITH PROPOFOL (N/A)  Patient location during evaluation: Endoscopy Anesthesia Type: General Level of consciousness: awake and alert and oriented Pain management: pain level controlled Vital Signs Assessment: post-procedure vital signs reviewed and stable Respiratory status: spontaneous breathing, nonlabored ventilation and respiratory function stable Cardiovascular status: blood pressure returned to baseline and stable Postop Assessment: no signs of nausea or vomiting Anesthetic complications: no     Last Vitals:  Vitals:   04/29/17 1535 04/29/17 1555  BP: 131/73 127/80  Pulse: 64   Resp: 20   Temp:      Last Pain:  Vitals:   04/29/17 1525  TempSrc: Tympanic  PainSc: 4                  Moe Brier

## 2017-04-29 NOTE — Transfer of Care (Signed)
Immediate Anesthesia Transfer of Care Note  Patient: Derek Merritt  Procedure(s) Performed: Procedure(s): COLONOSCOPY WITH PROPOFOL (N/A) ESOPHAGOGASTRODUODENOSCOPY (EGD) WITH PROPOFOL (N/A)  Patient Location: PACU  Anesthesia Type:General  Level of Consciousness: awake  Airway & Oxygen Therapy: Patient Spontanous Breathing and Patient connected to nasal cannula oxygen  Post-op Assessment: Report given to RN and Post -op Vital signs reviewed and stable  Post vital signs: Reviewed and stable  Last Vitals:  Vitals:   04/29/17 1334 04/29/17 1525  BP: (!) 156/72 126/68  Pulse: 61 70  Resp: 20 (!) 23  Temp: (!) 36.2 C (!) 35.6 C    Last Pain:  Vitals:   04/29/17 1525  TempSrc: Tympanic  PainSc: 4          Complications: No apparent anesthesia complications

## 2017-04-29 NOTE — Anesthesia Preprocedure Evaluation (Signed)
Anesthesia Evaluation  Patient identified by MRN, date of birth, ID band Patient awake    Reviewed: Allergy & Precautions, NPO status , Patient's Chart, lab work & pertinent test results  History of Anesthesia Complications Negative for: history of anesthetic complications  Airway Mallampati: III  TM Distance: >3 FB Neck ROM: Full    Dental  (+) Poor Dentition   Pulmonary neg sleep apnea, neg COPD, former smoker,    breath sounds clear to auscultation- rhonchi (-) wheezing      Cardiovascular hypertension, Pt. on medications + CAD and + CABG (2001)  (-) Cardiac Stents  Rhythm:Regular Rate:Normal - Systolic murmurs and - Diastolic murmurs    Neuro/Psych  Headaches, negative psych ROS   GI/Hepatic Neg liver ROS, GERD  ,  Endo/Other  negative endocrine ROSneg diabetes  Renal/GU negative Renal ROS     Musculoskeletal negative musculoskeletal ROS (+)   Abdominal (+) + obese,   Peds  Hematology negative hematology ROS (+)   Anesthesia Other Findings Past Medical History: No date: Coronary artery disease No date: GERD (gastroesophageal reflux disease) No date: Headache     Comment:  chronic No date: Hypertension     Comment:  accelerated hypertension with diastolic congestive heart              failure   Reproductive/Obstetrics                             Anesthesia Physical Anesthesia Plan  ASA: III  Anesthesia Plan: General   Post-op Pain Management:    Induction: Intravenous  PONV Risk Score and Plan: 1 and Propofol  Airway Management Planned: Natural Airway  Additional Equipment:   Intra-op Plan:   Post-operative Plan:   Informed Consent: I have reviewed the patients History and Physical, chart, labs and discussed the procedure including the risks, benefits and alternatives for the proposed anesthesia with the patient or authorized representative who has indicated his/her  understanding and acceptance.   Dental advisory given  Plan Discussed with: CRNA and Anesthesiologist  Anesthesia Plan Comments:         Anesthesia Quick Evaluation

## 2017-04-29 NOTE — Op Note (Signed)
[facility] Gastroenterology Patient Name: Derek Merritt Procedure Date: 04/29/2017 1:52 PM MRN: [id number] Account #: [id number] Date of Birth: July 21, 1946 Admit Type: Outpatient Age: 71 Room: [facility] ENDO ROOM 1 Gender: Male Note Status: Finalized Procedure:            Upper GI endoscopy Indications:          Dyspepsia, Gastro-esophageal reflux disease Providers:            Lollie Sails, MD Referring MD:         Claudie Leach Medicines:            Monitored Anesthesia Care Complications:        No immediate complications. Procedure:            Pre-Anesthesia Assessment:                       - ASA Grade Assessment: III - A patient with severe                        systemic disease.                       After obtaining informed consent, the endoscope was                        passed under direct vision. Throughout the procedure,                        the patient's blood pressure, pulse, and oxygen                        saturations were monitored continuously. The Endoscope                        was introduced through the mouth, and advanced to the                        third part of duodenum. The upper GI endoscopy was                        accomplished without difficulty. The patient tolerated                        the procedure well. Findings:      The Z-line was variable. Biopsies were taken with a cold forceps for       histology.      The exam of the esophagus was otherwise normal.      A single 4 mm semi-sessile polyp with no bleeding and no stigmata of       recent bleeding was found in the cardia. Biopsies were taken with a cold       forceps for histology.      Patchy mild inflammation characterized by congestion (edema), erosions       and erythema was found in the gastric antrum. Biopsies were taken with a       cold forceps for histology.      The cardia and gastric fundus were normal on retroflexion otherwise.      The examined  duodenum was normal. Impression:           - Z-line variable. Biopsied.                       -  A single gastric polyp. Biopsied.                       - Erosive gastritis. Biopsied.                       - Normal examined duodenum. Recommendation:       - Use Protonix (pantoprazole) 40 mg PO BID for 4 weeks.                       - Use Protonix (pantoprazole) 40 mg PO daily daily. Procedure Code(s):    --- Professional ---                       [phone], Esophagogastroduodenoscopy, flexible, transoral;                        with biopsy, single or multiple Diagnosis Code(s):    --- Professional ---                       K22.8, Other specified diseases of esophagus                       K31.7, Polyp of stomach and duodenum                       K29.60, Other gastritis without bleeding                       R10.13, Epigastric pain                       K21.9, Gastro-esophageal reflux disease without                        esophagitis CPT copyright 2016 American Medical Association. All rights reserved. The codes documented in this report are preliminary and upon coder review may  be revised to meet current compliance requirements. Lollie Sails, MD 04/29/2017 2:48:05 PM This report has been signed electronically. Number of Addenda: 0 Note Initiated On: 04/29/2017 1:52 PM      [facility]

## 2017-04-29 NOTE — Anesthesia Procedure Notes (Signed)
Date/Time: 04/29/2017 2:20 PM Performed by: Allean Found Pre-anesthesia Checklist: Patient identified, Emergency Drugs available, Suction available, Patient being monitored and Timeout performed Patient Re-evaluated:Patient Re-evaluated prior to induction Oxygen Delivery Method: Nasal cannula Placement Confirmation: positive ETCO2

## 2017-04-29 NOTE — Anesthesia Post-op Follow-up Note (Cosign Needed)
Anesthesia QCDR form completed.        

## 2017-04-29 NOTE — Op Note (Signed)
[facility] Gastroenterology Patient Name: Derek Merritt Procedure Date: 04/29/2017 1:52 PM MRN: [id number] Account #: [id number] Date of Birth: 1946/02/13 Admit Type: Outpatient Age: 71 Room: [facility] ENDO ROOM 1 Gender: Male Note Status: Finalized Procedure:            Colonoscopy Indications:          Personal history of colonic polyps Providers:            Lollie Sails, MD Referring MD:         Claudie Leach Medicines:            Monitored Anesthesia Care Complications:        No immediate complications. Procedure:            Pre-Anesthesia Assessment:                       - ASA Grade Assessment: III - A patient with severe                        systemic disease.                       After obtaining informed consent, the colonoscope was                        passed under direct vision. Throughout the procedure,                        the patient's blood pressure, pulse, and oxygen                        saturations were monitored continuously. The                        Colonoscope was introduced through the anus and                        advanced to the the cecum, identified by appendiceal                        orifice and ileocecal valve. The colonoscopy was                        performed with moderate difficulty due to significant                        looping. Successful completion of the procedure was                        aided by using manual pressure. The quality of the                        bowel preparation was fair. Findings:      A 1 mm polyp was found in the cecum. The polyp was semi-pedunculated.       The polyp was removed with a cold biopsy forceps. Resection and       retrieval were complete.      A 2 mm polyp was found in the transverse colon. The polyp was sessile.       The polyp was removed with a cold biopsy forceps. Resection and  retrieval were complete.      Multiple small-mouthed diverticula were found in the  sigmoid colon and       descending colon.      The retroflexed view of the distal rectum and anal verge was normal and       showed no anal or rectal abnormalities.      The digital rectal exam was normal.      Non-bleeding internal hemorrhoids were found during retroflexion and       during anoscopy. The hemorrhoids were small and Grade I (internal       hemorrhoids that do not prolapse). Impression:           - Preparation of the colon was fair.                       - One 1 mm polyp in the cecum, removed with a cold                        biopsy forceps. Resected and retrieved.                       - One 2 mm polyp in the transverse colon, removed with                        a cold biopsy forceps. Resected and retrieved.                       - Diverticulosis in the sigmoid colon and in the                        descending colon.                       - The distal rectum and anal verge are normal on                        retroflexion view. Recommendation:       - Discharge patient to home.                       - Telephone GI clinic for pathology results in 1 week.                       - Return to GI clinic in 4 weeks. Procedure Code(s):    --- Professional ---                       [phone], Colonoscopy, flexible; with biopsy, single or                        multiple Diagnosis Code(s):    --- Professional ---                       D12.0, Benign neoplasm of cecum                       D12.3, Benign neoplasm of transverse colon (hepatic                        flexure or splenic flexure)  Z86.010, Personal history of colonic polyps                       K57.30, Diverticulosis of large intestine without                        perforation or abscess without bleeding CPT copyright 2016 American Medical Association. All rights reserved. The codes documented in this report are preliminary and upon coder review may  be revised to meet current compliance  requirements. Lollie Sails, MD 04/29/2017 3:17:24 PM This report has been signed electronically. Number of Addenda: 0 Note Initiated On: 04/29/2017 1:52 PM Scope Withdrawal Time: 0 hours 6 minutes 41 seconds  Total Procedure Duration: 0 hours 20 minutes 31 seconds       [facility]

## 2017-04-29 NOTE — H&P (Signed)
Outpatient short stay form Pre-procedure 04/29/2017 2:02 PM Lollie Sails MD  Primary Physician:  Raliegh Ip. Johnanna Schneiders, NP  Reason for visit:  EGD and colonoscopy  History of present illness:  Patient is a 71 year old male presenting today as above. He is been having several months of increased symptoms of eructation as well as left-sided abdominal pain. This is in the left upper quadrant as well as seemingly in the left lower quadrant as well. He does have a personal history of adenomatous colon polyps. He is been having increasing reflux symptoms. He does intermittently take diclofenac and was previously taking omeprazole suboptimally however states he is currently taking twice a day. Continues to have the symptoms. He does take a 325 mg aspirin daily but has held that for about 5 days. He did have a knee replacement with hardware left in the knee. This was in 2017. We will need to use a antibiotic prophylaxis. He is not allergic penicillin.    Current Facility-Administered Medications:  .  0.9 %  sodium chloride infusion, , Intravenous, Continuous, Lollie Sails, MD .  0.9 %  sodium chloride infusion, , Intravenous, Continuous, Lollie Sails, MD .  ampicillin (OMNIPEN) 2 g in sodium chloride 0.9 % 50 mL IVPB, 2 g, Intravenous, Once, Lollie Sails, MD  Prescriptions Prior to Admission  Medication Sig Dispense Refill Last Dose  . aspirin 325 MG tablet Take 325 mg by mouth daily.   Past Week at Unknown time  . diclofenac (VOLTAREN) 75 MG EC tablet Take 75 mg by mouth 2 (two) times daily.   Past Month at Unknown time  . fenofibrate 54 MG tablet Take 54 mg by mouth daily.   Past Week at Unknown time  . furosemide (LASIX) 40 MG tablet Take 40 mg by mouth daily as needed.   Past Week at Unknown time  . hydrochlorothiazide (HYDRODIURIL) 25 MG tablet Take 25 mg by mouth daily.   04/29/2017 at 1045  . losartan (COZAAR) 50 MG tablet Take 50 mg by mouth daily.   Past Week at Unknown time  .  metFORMIN (GLUCOPHAGE) 500 MG tablet Take by mouth 2 (two) times daily with a meal.   Past Week at Unknown time  . metoprolol tartrate (LOPRESSOR) 50 MG tablet Take 37.5 mg by mouth 1 day or 1 dose.   04/29/2017 at 1045  . Multiple Vitamin (MULTIVITAMIN) tablet Take 1 tablet by mouth daily.   Past Week at Unknown time  . omeprazole (PRILOSEC) 20 MG capsule Take 20 mg by mouth daily.   04/29/2017 at 1045  . simvastatin (ZOCOR) 20 MG tablet Take 20 mg by mouth daily.   04/28/2017 at Unknown time  . fluticasone (FLONASE) 50 MCG/ACT nasal spray Place 2 sprays into both nostrils daily.   Not Taking at Unknown time  . neomycin-polymyxin-hydrocortisone (CORTISPORIN) OTIC solution 4 (four) times daily.   Not Taking at Unknown time     Allergies  Allergen Reactions  . Lisinopril      Past Medical History:  Diagnosis Date  . Coronary artery disease   . GERD (gastroesophageal reflux disease)   . Headache    chronic  . Hypertension    accelerated hypertension with diastolic congestive heart failure    Review of systems:      Physical Exam    Heart and lungs: Regular rate and rhythm without rub or gallop, lungs are bilaterally clear.    HEENT: Normocephalic atraumatic eyes are anicteric    Other:  Pertinant exam for procedure: Soft nontender nondistended bowel sounds positive normoactive, obese.    Planned proceedures: EGD, colonoscopy and indicated procedures. I have discussed the risks benefits and complications of procedures to include not limited to bleeding, infection, perforation and the risk of sedation and the patient wishes to proceed.    Lollie Sails, MD Gastroenterology 04/29/2017  2:02 PM

## 2017-04-30 ENCOUNTER — Encounter: Payer: Self-pay | Admitting: Gastroenterology

## 2017-05-01 LAB — SURGICAL PATHOLOGY

## 2017-07-03 ENCOUNTER — Ambulatory Visit
Admission: RE | Admit: 2017-07-03 | Discharge: 2017-07-03 | Disposition: A | Discharge status: Home / Self Care | Payer: Medicare Other | Source: Ambulatory Visit | Attending: Family Medicine | Admitting: Family Medicine

## 2017-07-03 ENCOUNTER — Other Ambulatory Visit (HOSPITAL_COMMUNITY): Payer: Self-pay | Admitting: Family Medicine

## 2017-07-03 ENCOUNTER — Other Ambulatory Visit: Payer: Self-pay | Admitting: Family Medicine

## 2017-07-03 DIAGNOSIS — R109 Unspecified abdominal pain: Secondary | ICD-10-CM | POA: Diagnosis not present

## 2017-07-03 DIAGNOSIS — K8020 Calculus of gallbladder without cholecystitis without obstruction: Secondary | ICD-10-CM | POA: Diagnosis not present

## 2018-06-25 ENCOUNTER — Other Ambulatory Visit: Payer: Self-pay | Admitting: Nurse Practitioner

## 2018-06-25 DIAGNOSIS — R1012 Left upper quadrant pain: Secondary | ICD-10-CM

## 2018-07-05 ENCOUNTER — Ambulatory Visit
Admission: RE | Admit: 2018-07-05 | Discharge: 2018-07-05 | Disposition: A | Discharge status: Home / Self Care | Payer: Medicare Other | Source: Ambulatory Visit | Attending: Nurse Practitioner | Admitting: Nurse Practitioner

## 2018-07-05 DIAGNOSIS — R1012 Left upper quadrant pain: Secondary | ICD-10-CM | POA: Insufficient documentation

## 2018-07-05 MED ORDER — TECHNETIUM TC 99M SULFUR COLLOID
2.2800 | Freq: Once | INTRAVENOUS | Status: AC | PRN
  Administered 2018-07-05: 2.28 via INTRAVENOUS

## 2018-07-09 ENCOUNTER — Other Ambulatory Visit: Payer: Self-pay | Admitting: Nurse Practitioner

## 2018-07-09 DIAGNOSIS — Z8719 Personal history of other diseases of the digestive system: Secondary | ICD-10-CM

## 2018-07-09 DIAGNOSIS — R1012 Left upper quadrant pain: Secondary | ICD-10-CM

## 2018-07-15 ENCOUNTER — Ambulatory Visit
Admission: RE | Admit: 2018-07-15 | Discharge: 2018-07-15 | Disposition: A | Discharge status: Home / Self Care | Payer: Medicare Other | Source: Ambulatory Visit | Attending: Nurse Practitioner | Admitting: Nurse Practitioner

## 2018-07-15 DIAGNOSIS — N281 Cyst of kidney, acquired: Secondary | ICD-10-CM | POA: Insufficient documentation

## 2018-07-15 DIAGNOSIS — K760 Fatty (change of) liver, not elsewhere classified: Secondary | ICD-10-CM | POA: Insufficient documentation

## 2018-07-15 DIAGNOSIS — I700 Atherosclerosis of aorta: Secondary | ICD-10-CM | POA: Diagnosis not present

## 2018-07-15 DIAGNOSIS — Z8719 Personal history of other diseases of the digestive system: Secondary | ICD-10-CM | POA: Diagnosis not present

## 2018-07-15 DIAGNOSIS — R1012 Left upper quadrant pain: Secondary | ICD-10-CM

## 2018-07-15 DIAGNOSIS — K8020 Calculus of gallbladder without cholecystitis without obstruction: Secondary | ICD-10-CM | POA: Insufficient documentation

## 2018-07-29 ENCOUNTER — Ambulatory Visit: Payer: Medicare Other | Admitting: Student in an Organized Health Care Education/Training Program

## 2018-08-26 ENCOUNTER — Ambulatory Visit (INDEPENDENT_AMBULATORY_CARE_PROVIDER_SITE_OTHER): Payer: Medicare Other | Admitting: Nurse Practitioner

## 2018-08-26 ENCOUNTER — Encounter (INDEPENDENT_AMBULATORY_CARE_PROVIDER_SITE_OTHER): Payer: Self-pay | Admitting: Nurse Practitioner

## 2018-08-26 VITALS — BP 116/68 | HR 67 | Resp 16 | Ht 74.0 in | Wt 385.0 lb

## 2018-08-26 DIAGNOSIS — R600 Localized edema: Secondary | ICD-10-CM

## 2018-08-26 DIAGNOSIS — I420 Dilated cardiomyopathy: Secondary | ICD-10-CM | POA: Insufficient documentation

## 2018-08-26 DIAGNOSIS — I255 Ischemic cardiomyopathy: Secondary | ICD-10-CM

## 2018-08-26 DIAGNOSIS — I152 Hypertension secondary to endocrine disorders: Secondary | ICD-10-CM

## 2018-08-26 DIAGNOSIS — I10 Essential (primary) hypertension: Secondary | ICD-10-CM | POA: Diagnosis not present

## 2018-08-26 DIAGNOSIS — E1159 Type 2 diabetes mellitus with other circulatory complications: Secondary | ICD-10-CM

## 2018-08-26 NOTE — Progress Notes (Signed)
Subjective:    Patient ID: Derek Merritt, male    DOB: 04/05/1946, 72 y.o.   MRN: [id number] Chief Complaint  Patient presents with  . New Patient (Initial Visit)    ref Dorthula Perfect for lymphedema    HPI  Derek Merritt is a 72 y.o. male that referred by Dr. Raechel Ache.  The patient has concerns of lower extremity leg swelling. The patient first noticed the swelling remotely but is now concerned because of a significant increase in the overall edema. The swelling is associated with pain and discoloration. The patient also has a history of neuropathy. The patient notes that in the morning the legs are significantly improved but they steadily worsened throughout the course of the day. Elevation makes the legs better, dependency makes them much worse.   There is no history of ulcerations associated with the swelling.   The patient denies any recent changes in their medications.  The patient has not been wearing graduated compression.  The patient has no had any past angiography.  The patient has had a triple bypass surgery, with usage of his right saphenous vein.   The patient denies a history of DVT or PE. There is no prior history of phlebitis. There is no history of primary lymphedema.  There is no history of radiation treatment to the groin or pelvis No history of malignancies. No history of trauma or groin or pelvic surgery. No history of foreign travel or parasitic infections area   The patient does endorse having a history of CHF.  He states that he has not gained any weight recently, as he weighs daily.  He denies any shortness of breath or chest pain.    Past Medical History:  Diagnosis Date  . Coronary artery disease   . GERD (gastroesophageal reflux disease)   . Headache    chronic  . Hypertension    accelerated hypertension with diastolic congestive heart failure    Past Surgical History:  Procedure Laterality Date  . anterior transposition of right ulnar nerve at the elbow     . APPENDECTOMY    . COLONOSCOPY    . COLONOSCOPY WITH PROPOFOL N/A 04/29/2017   Procedure: COLONOSCOPY WITH PROPOFOL;  Surgeon: Lollie Sails, MD;  Location: [facility] ENDOSCOPY;  Service: Endoscopy;  Laterality: N/A;  . CORONARY ARTERY BYPASS GRAFT     x4  . ESOPHAGOGASTRODUODENOSCOPY (EGD) WITH PROPOFOL N/A 04/29/2017   Procedure: ESOPHAGOGASTRODUODENOSCOPY (EGD) WITH PROPOFOL;  Surgeon: Lollie Sails, MD;  Location: [facility] ENDOSCOPY;  Service: Endoscopy;  Laterality: N/A;  . EYE SURGERY     cataract  . spinal fusion surgery      Social History   Socioeconomic History  . Marital status: Widowed    Spouse name: Not on file  . Number of children: Not on file  . Years of education: Not on file  . Highest education level: Not on file  Occupational History  . Not on file  Social Needs  . Financial resource strain: Not on file  . Food insecurity:    Worry: Not on file    Inability: Not on file  . Transportation needs:    Medical: Not on file    Non-medical: Not on file  Tobacco Use  . Smoking status: Former Smoker    Years: 10.00    Types: Cigarettes  . Smokeless tobacco: Never Used  Substance and Sexual Activity  . Alcohol use: No  . Drug use: No  . Sexual activity: Not on  file  Lifestyle  . Physical activity:    Days per week: Not on file    Minutes per session: Not on file  . Stress: Not on file  Relationships  . Social connections:    Talks on phone: Not on file    Gets together: Not on file    Attends religious service: Not on file    Active member of club or organization: Not on file    Attends meetings of clubs or organizations: Not on file    Relationship status: Not on file  . Intimate partner violence:    Fear of current or ex partner: Not on file    Emotionally abused: Not on file    Physically abused: Not on file    Forced sexual activity: Not on file  Other Topics Concern  . Not on file  Social History Narrative  . Not on file    Family  History  Problem Relation Age of Onset  . Heart attack Maternal Grandmother     Allergies  Allergen Reactions  . Ticagrelor Shortness Of Breath  . Lisinopril      Review of Systems   Review of Systems: Negative Unless Checked Constitutional: [] Weight loss  [] Fever  [] Chills Cardiac: [] Chest pain   []  Atrial Fibrillation  [] Palpitations   [] Shortness of breath when laying flat   [] Shortness of breath with exertion. Vascular:  [] Pain in legs with walking   [] Pain in legs with standing  [] History of DVT   [] Phlebitis   [x] Swelling in legs   [] Varicose veins   [] Non-healing ulcers Pulmonary:   [] Uses home oxygen   [] Productive cough   [] Hemoptysis   [] Wheeze  [] COPD   [] Asthma Neurologic:  [] Dizziness   [] Seizures   [] History of stroke   [] History of TIA  [] Aphasia   [] Vissual changes   [] Weakness or numbness in arm   [x] Weakness or numbness in leg Musculoskeletal:   [x] Joint swelling   [] Joint pain   [] Low back pain  []  History of Knee Replacement Hematologic:  [] Easy bruising  [] Easy bleeding   [] Hypercoagulable state   [] Anemic Gastrointestinal:  [] Diarrhea   [] Vomiting  [] Gastroesophageal reflux/heartburn   [] Difficulty swallowing. Genitourinary:  [] Chronic kidney disease   [] Difficult urination  [] Anuric   [] Blood in urine Skin:  [] Rashes   [] Ulcers  Psychological:  [] History of anxiety   []  History of major depression  []  Memory Difficulties     Objective:   Physical Exam  BP 116/68 (BP Location: Right Arm)   Pulse 67   Resp 16   Ht 6\' 2"  (1.88 m)   Wt (!) 385 lb (174.6 kg)   BMI 49.43 kg/m   Gen: WD/WN, NAD, morbidly obese Head: [place]/AT, No temporalis wasting.  Ear/Nose/Throat: Hearing grossly intact, nares w/o erythema or drainage Eyes: PER, EOMI, sclera nonicteric.  Neck: Supple, no masses.  No JVD.  Pulmonary:  Good air movement, no use of accessory muscles.  Cardiac: RRR Vascular: 3+ pitting edema  Vessel Right Left  Radial Palpable Palpable  Dorsalis Pedis  Palpable Palpable  Posterior Tibial Palpable Palpable   Gastrointestinal: soft, non-distended. No guarding/no peritoneal signs.  Musculoskeletal: M/S 5/5 throughout.  No deformity or atrophy.  Neurologic: Neuropathy bilateral lower extremity.  Symmetrical.  Speech is fluent. Motor exam as listed above. Psychiatric: Judgment intact, Mood & affect appropriate for pt's clinical situation. Dermatologic: No Venous rashes. No Ulcers Noted.  No changes consistent with cellulitis. Lymph : No Cervical lymphadenopathy, no lichenification or skin  changes of chronic lymphedema.      Assessment & Plan:   1. Lower extremity edema I have had a long discussion with the patient regarding swelling and why it  causes symptoms.  Patient will begin wearing graduated compression stockings class 1 (20-30 mmHg) on a daily basis a prescription was given. The patient will  beginning wearing the stockings first thing in the morning and removing them in the evening. The patient is instructed specifically not to sleep in the stockings.   In addition, behavioral modification will be initiated.  This will include frequent elevation, use of over the counter pain medications and exercise such as walking.  I have reviewed systemic causes for chronic edema such as liver, kidney and cardiac etiologies.    Consideration for a lymph pump will also be made based upon the effectiveness of conservative therapy.  This would help to improve the edema control and prevent sequela such as ulcers and infections   Patient should undergo duplex ultrasound of the venous system to ensure that DVT or reflux is not present.  The patient will follow-up with me after the ultrasound.   - VAS [place] LOWER EXTREMITY VENOUS REFLUX; Future  2. Hypertension associated with type 2 diabetes mellitus ([place]) Continue antihypertensive medications as already ordered, these medications have been reviewed and there are no changes at this time.   3.  Ischemic dilated cardiomyopathy ([place]) The patient's edema could be a component of his heart failure.  Patient has an upcoming appointment with his cardiologist.  I advised the patient to speak with patient about lower extremity and if changes in medical therapy are needed.     Current Outpatient Medications on File Prior to Visit  Medication Sig Dispense Refill  . aspirin 81 MG tablet Take 81 mg by mouth daily.     Marland Kitchen atorvastatin (LIPITOR) 80 MG tablet Take by mouth.    . botulinum toxin Type A (BOTOX) 100 units SOLR injection Inject 100 Units into the muscle once.    . Cholecalciferol (VITAMIN D-1000 MAX ST) 25 MCG (1000 UT) tablet Take by mouth.    . clopidogrel (PLAVIX) 75 MG tablet Take by mouth.    . famotidine (PEPCID) 40 MG tablet Take 40 mg by mouth daily.    . furosemide (LASIX) 40 MG tablet Take 40 mg by mouth daily as needed.    . gabapentin (NEURONTIN) 600 MG tablet Take 600 mg by mouth 3 (three) times daily.    . irbesartan (AVAPRO) 150 MG tablet Take 150 mg by mouth daily.    . isosorbide mononitrate (IMDUR) 60 MG 24 hr tablet Take by mouth.    . losartan (COZAAR) 50 MG tablet Take 50 mg by mouth daily.    . metFORMIN (GLUCOPHAGE) 500 MG tablet Take by mouth 2 (two) times daily with a meal.    . metoprolol succinate (TOPROL-XL) 50 MG 24 hr tablet Take by mouth.    . metoprolol tartrate (LOPRESSOR) 50 MG tablet Take 37.5 mg by mouth 1 day or 1 dose.    . Multiple Vitamin (MULTIVITAMIN) tablet Take 1 tablet by mouth daily.    . nitroGLYCERIN (NITROSTAT) 0.4 MG SL tablet Place 0.4 mg under the tongue every 5 (five) minutes as needed for chest pain.    Marland Kitchen nystatin (MYCOSTATIN/NYSTOP) powder Apply topically.    . pantoprazole (PROTONIX) 40 MG tablet Take by mouth.    . pregabalin (LYRICA) 25 MG capsule     . spironolactone (ALDACTONE) 25 MG tablet  Take by mouth.    . traMADol (ULTRAM) 50 MG tablet tramadol 50 mg tablet    . venlafaxine (EFFEXOR) 75 MG tablet Take by mouth.    .  vitamin B-12 (CYANOCOBALAMIN) 1000 MCG tablet Take by mouth.    . diclofenac (VOLTAREN) 75 MG EC tablet Take 75 mg by mouth 2 (two) times daily.    . fenofibrate 54 MG tablet Take 54 mg by mouth daily.    . fluticasone (FLONASE) 50 MCG/ACT nasal spray Place 2 sprays into both nostrils daily.    Marland Kitchen gemfibrozil (LOPID) 600 MG tablet gemfibrozil 600 mg tablet    . hydrochlorothiazide (HYDRODIURIL) 25 MG tablet Take 25 mg by mouth daily.    Marland Kitchen neomycin-polymyxin-hydrocortisone (CORTISPORIN) OTIC solution 4 (four) times daily.    Marland Kitchen omeprazole (PRILOSEC) 20 MG capsule Take 20 mg by mouth daily.    . simvastatin (ZOCOR) 20 MG tablet Take 20 mg by mouth daily.     No current facility-administered medications on file prior to visit.     There are no Patient Instructions on file for this visit. Return in about 4 weeks (around 09/23/2018).   Kris Hartmann, NP  This note was completed with Sales executive.  Any errors are purely unintentional.

## 2018-09-25 ENCOUNTER — Encounter (INDEPENDENT_AMBULATORY_CARE_PROVIDER_SITE_OTHER): Payer: Medicare Other

## 2018-09-25 ENCOUNTER — Ambulatory Visit (INDEPENDENT_AMBULATORY_CARE_PROVIDER_SITE_OTHER): Payer: Medicare Other | Admitting: Nurse Practitioner

## 2018-10-23 ENCOUNTER — Ambulatory Visit (INDEPENDENT_AMBULATORY_CARE_PROVIDER_SITE_OTHER): Payer: Medicare Other | Admitting: Nurse Practitioner

## 2018-10-23 ENCOUNTER — Encounter (INDEPENDENT_AMBULATORY_CARE_PROVIDER_SITE_OTHER): Payer: Medicare Other

## 2018-11-25 ENCOUNTER — Encounter (INDEPENDENT_AMBULATORY_CARE_PROVIDER_SITE_OTHER): Payer: Medicare Other

## 2018-11-25 ENCOUNTER — Ambulatory Visit (INDEPENDENT_AMBULATORY_CARE_PROVIDER_SITE_OTHER): Payer: Medicare Other | Admitting: Nurse Practitioner

## 2018-12-01 ENCOUNTER — Other Ambulatory Visit (INDEPENDENT_AMBULATORY_CARE_PROVIDER_SITE_OTHER): Payer: Self-pay | Admitting: Nurse Practitioner

## 2018-12-01 DIAGNOSIS — R601 Generalized edema: Secondary | ICD-10-CM

## 2018-12-04 ENCOUNTER — Ambulatory Visit (INDEPENDENT_AMBULATORY_CARE_PROVIDER_SITE_OTHER): Payer: Medicare Other

## 2018-12-04 ENCOUNTER — Ambulatory Visit (INDEPENDENT_AMBULATORY_CARE_PROVIDER_SITE_OTHER): Payer: Medicare Other | Admitting: Nurse Practitioner

## 2018-12-04 ENCOUNTER — Encounter (INDEPENDENT_AMBULATORY_CARE_PROVIDER_SITE_OTHER): Payer: Self-pay | Admitting: Nurse Practitioner

## 2018-12-04 ENCOUNTER — Other Ambulatory Visit: Payer: Self-pay

## 2018-12-04 VITALS — BP 116/61 | HR 60 | Resp 12 | Ht 74.0 in | Wt 384.0 lb

## 2018-12-04 DIAGNOSIS — E1159 Type 2 diabetes mellitus with other circulatory complications: Secondary | ICD-10-CM | POA: Diagnosis not present

## 2018-12-04 DIAGNOSIS — Z87891 Personal history of nicotine dependence: Secondary | ICD-10-CM

## 2018-12-04 DIAGNOSIS — R601 Generalized edema: Secondary | ICD-10-CM | POA: Diagnosis not present

## 2018-12-04 DIAGNOSIS — R600 Localized edema: Secondary | ICD-10-CM

## 2018-12-04 DIAGNOSIS — Z79899 Other long term (current) drug therapy: Secondary | ICD-10-CM

## 2018-12-04 DIAGNOSIS — I10 Essential (primary) hypertension: Secondary | ICD-10-CM

## 2018-12-04 DIAGNOSIS — L03115 Cellulitis of right lower limb: Secondary | ICD-10-CM | POA: Diagnosis not present

## 2018-12-04 DIAGNOSIS — I255 Ischemic cardiomyopathy: Secondary | ICD-10-CM | POA: Diagnosis not present

## 2018-12-04 DIAGNOSIS — I420 Dilated cardiomyopathy: Secondary | ICD-10-CM

## 2018-12-04 MED ORDER — DOXYCYCLINE HYCLATE 100 MG PO CAPS: 100.0000 mg | ORAL_CAPSULE | Freq: Two times a day (BID) | ORAL | 0 refills | Status: AC

## 2018-12-09 ENCOUNTER — Encounter (INDEPENDENT_AMBULATORY_CARE_PROVIDER_SITE_OTHER): Payer: Self-pay | Admitting: Nurse Practitioner

## 2018-12-09 NOTE — Progress Notes (Signed)
SUBJECTIVE:  Patient ID: Derek Merritt, male    DOB: Oct 27, 1945, 73 y.o.   MRN: [id number] Chief Complaint  Patient presents with  . Follow-up    HPI  Derek Merritt is a 73 y.o. male that returns today for follow-up of his lower extremity edema and swelling as well as review of noninvasive testing.  The patient continues to have extensive lower extremity swelling despite utilizing medical grade 1 compression stockings.  He also has a lymphedema pump which she has been utilizing on a regular basis.  He states that this lymphedema pump has been helping his edema significantly.  The patient denies any fever, chills, nausea, vomiting or diarrhea.  The patient has a history of dilated cardiomyopathy, however he states that he is not had any great weight changes, shortness of breath, or weeping of the lower extremities.  He endorses taking his diuretics on a daily basis as prescribed as well as having frequent bathroom trips.  He has a complaint of on his right lower extremity it appears to be fairly red he states that the redness has decreased however it happened suddenly and was very hot to the touch.  The patient underwent a bilateral lower extremity venous reflux today which revealed reflux in the common femoral vein of the right lower extremity.  There is also some reflux in the great saphenous vein at the saphenofemoral junction.  The left lower extremity has reflux in the common femoral vein, with evidence of a ligated great saphenous vein following cardiac bypass surgery.  There is no evidence of DVT or superficial venous thrombosis bilaterally.  Past Medical History:  Diagnosis Date  . Coronary artery disease   . GERD (gastroesophageal reflux disease)   . Headache    chronic  . Hypertension    accelerated hypertension with diastolic congestive heart failure    Past Surgical History:  Procedure Laterality Date  . anterior transposition of right ulnar nerve at the elbow    .  APPENDECTOMY    . COLONOSCOPY    . COLONOSCOPY WITH PROPOFOL N/A 04/29/2017   Procedure: COLONOSCOPY WITH PROPOFOL;  Surgeon: Lollie Sails, MD;  Location: [facility] ENDOSCOPY;  Service: Endoscopy;  Laterality: N/A;  . CORONARY ARTERY BYPASS GRAFT     x4  . ESOPHAGOGASTRODUODENOSCOPY (EGD) WITH PROPOFOL N/A 04/29/2017   Procedure: ESOPHAGOGASTRODUODENOSCOPY (EGD) WITH PROPOFOL;  Surgeon: Lollie Sails, MD;  Location: [facility] ENDOSCOPY;  Service: Endoscopy;  Laterality: N/A;  . EYE SURGERY     cataract  . spinal fusion surgery      Social History   Socioeconomic History  . Marital status: Widowed    Spouse name: Not on file  . Number of children: Not on file  . Years of education: Not on file  . Highest education level: Not on file  Occupational History  . Not on file  Social Needs  . Financial resource strain: Not on file  . Food insecurity:    Worry: Not on file    Inability: Not on file  . Transportation needs:    Medical: Not on file    Non-medical: Not on file  Tobacco Use  . Smoking status: Former Smoker    Years: 10.00    Types: Cigarettes  . Smokeless tobacco: Never Used  Substance and Sexual Activity  . Alcohol use: No  . Drug use: No  . Sexual activity: Not on file  Lifestyle  . Physical activity:    Days per week: Not on  file    Minutes per session: Not on file  . Stress: Not on file  Relationships  . Social connections:    Talks on phone: Not on file    Gets together: Not on file    Attends religious service: Not on file    Active member of club or organization: Not on file    Attends meetings of clubs or organizations: Not on file    Relationship status: Not on file  . Intimate partner violence:    Fear of current or ex partner: Not on file    Emotionally abused: Not on file    Physically abused: Not on file    Forced sexual activity: Not on file  Other Topics Concern  . Not on file  Social History Narrative  . Not on file    Family History    Problem Relation Age of Onset  . Heart attack Maternal Grandmother     Allergies  Allergen Reactions  . Ticagrelor Shortness Of Breath  . Lisinopril      Review of Systems   Review of Systems: Negative Unless Checked Constitutional: [] Weight loss  [] Fever  [] Chills Cardiac: [] Chest pain   []  Atrial Fibrillation  [] Palpitations   [] Shortness of breath when laying flat   [] Shortness of breath with exertion. [] Shortness of breath at rest Vascular:  [] Pain in legs with walking   [] Pain in legs with standing [] Pain in legs when laying flat   [] Claudication    [] Pain in feet when laying flat    [] History of DVT   [] Phlebitis   [x] Swelling in legs   [] Varicose veins   [] Non-healing ulcers Pulmonary:   [] Uses home oxygen   [] Productive cough   [] Hemoptysis   [] Wheeze  [] COPD   [] Asthma Neurologic:  [] Dizziness   [] Seizures  [] Blackouts [] History of stroke   [] History of TIA  [] Aphasia   [] Temporary Blindness   [] Weakness or numbness in arm   [] Weakness or numbness in leg Musculoskeletal:   [] Joint swelling   [] Joint pain   [] Low back pain  []  History of Knee Replacement [x] Arthritis [] back Surgeries  []  Spinal Stenosis    Hematologic:  [] Easy bruising  [] Easy bleeding   [] Hypercoagulable state   [] Anemic Gastrointestinal:  [] Diarrhea   [] Vomiting  [] Gastroesophageal reflux/heartburn   [] Difficulty swallowing. [] Abdominal pain Genitourinary:  [] Chronic kidney disease   [] Difficult urination  [] Anuric   [] Blood in urine [] Frequent urination  [] Burning with urination   [] Hematuria Skin:  [] Rashes   [] Ulcers [] Wounds Psychological:  [] History of anxiety   []  History of major depression  []  Memory Difficulties      OBJECTIVE:   Physical Exam  BP 116/61 (BP Location: Left Arm, Patient Position: Sitting, Cuff Size: Large)   Pulse 60   Resp 12   Ht 6\' 2"  (1.88 m)   Wt (!) 384 lb (174.2 kg)   BMI 49.30 kg/m   Gen: WD/WN, NAD Head: [place]/AT, No temporalis wasting.  Ear/Nose/Throat: Hearing  grossly intact, nares w/o erythema or drainage Eyes: PER, EOMI, sclera nonicteric.  Neck: Supple, no masses.  No JVD.  Pulmonary:  Good air movement, no use of accessory muscles.  Cardiac: RRR Vascular:  4+ pitting edema bilaterally Vessel Right Left  Radial Palpable Palpable   Gastrointestinal: soft, non-distended. No guarding/no peritoneal signs.  Musculoskeletal: M/S 5/5 throughout.  No deformity or atrophy.  Neurologic: Pain and light touch intact in extremities.  Symmetrical.  Speech is fluent. Motor exam as listed above. Psychiatric: Judgment  intact, Mood & affect appropriate for pt's clinical situation Dermatologic: No Venous rashes. No Ulcers Noted.  Changes consistent with cellulitis. Lymph : No Cervical lymphadenopathy, no lichenification or skin changes of chronic lymphedema.       ASSESSMENT AND PLAN:  1. Lower extremity edema The patient underwent a bilateral lower extremity venous reflux today which revealed reflux in the common femoral vein of the right lower extremity.  There is also some reflux in the great saphenous vein at the saphenofemoral junction.  The left lower extremity has reflux in the common femoral vein, with evidence of a ligated great saphenous vein following cardiac bypass surgery.  There is no evidence of DVT or superficial venous thrombosis bilaterally.  No surgery or intervention at this point in time.    I have had a long discussion with the patient regarding venous insufficiency and why it  causes symptoms, specifically venous ulceration . I have discussed with the patient the chronic skin changes that accompany venous insufficiency and the long term sequela such as infection and recurring  ulceration.  Patient will be placed in [employer] which will be changed weekly drainage permitting.  In addition, behavioral modification including several periods of elevation of the lower extremities during the day will be continued. Achieving a position with the  ankles at heart level was stressed to the patient  The patient is instructed to begin routine exercise, especially walking on a daily basis  We will have the patient return and evaluate his lower extremity swelling within about 4 weeks.   2. Ischemic dilated cardiomyopathy ([place]) Patient endorses weighing daily and he denies any weight changes such as gaining 5 pounds in a week or 2 pounds in a day.  He states that he has been roughly the same weight.  He also endorses taking his diuretics daily.  I have encouraged him to follow-up with his cardiologist in regards to his pitting edema, as this could be a component of an exacerbation of his heart failure.  3. Hypertension associated with type 2 diabetes mellitus ([place]) Continue antihypertensive medications as already ordered, these medications have been reviewed and there are no changes at this time.   4. Cellulitis of leg, right Patient complained of sudden onset of redness of right leg.  Although he states that it appears much better it is still fairly erythematous.  We will do short course of antibiotics, in addition to the bilateral Unna wraps in order to treat his cellulitis. - doxycycline (VIBRAMYCIN) 100 MG capsule; Take 1 capsule (100 mg total) by mouth 2 (two) times daily.  Dispense: 10 capsule; Refill: 0   Current Outpatient Medications on File Prior to Visit  Medication Sig Dispense Refill  . aspirin 81 MG tablet Take 81 mg by mouth daily.     Marland Kitchen atorvastatin (LIPITOR) 80 MG tablet Take by mouth.    . botulinum toxin Type A (BOTOX) 100 units SOLR injection Inject 100 Units into the muscle once.    . Cholecalciferol (VITAMIN D-1000 MAX ST) 25 MCG (1000 UT) tablet Take by mouth.    . clopidogrel (PLAVIX) 75 MG tablet Take by mouth.    . famotidine (PEPCID) 40 MG tablet Take 40 mg by mouth daily.    . furosemide (LASIX) 40 MG tablet Take 40 mg by mouth daily as needed.    . isosorbide mononitrate (IMDUR) 60 MG 24 hr tablet Take by  mouth.    . losartan (COZAAR) 50 MG tablet Take 50 mg by mouth  daily.    . metFORMIN (GLUCOPHAGE) 500 MG tablet Take by mouth 2 (two) times daily with a meal.    . metoprolol succinate (TOPROL-XL) 50 MG 24 hr tablet Take by mouth.    . Multiple Vitamin (MULTIVITAMIN) tablet Take 1 tablet by mouth daily.    . nitroGLYCERIN (NITROSTAT) 0.4 MG SL tablet Place 0.4 mg under the tongue every 5 (five) minutes as needed for chest pain.    Marland Kitchen nystatin (MYCOSTATIN/NYSTOP) powder Apply topically.    . pantoprazole (PROTONIX) 40 MG tablet Take by mouth.    . spironolactone (ALDACTONE) 25 MG tablet Take by mouth.    . vitamin B-12 (CYANOCOBALAMIN) 1000 MCG tablet Take by mouth.    . diclofenac (VOLTAREN) 75 MG EC tablet Take 75 mg by mouth 2 (two) times daily.    . fenofibrate 54 MG tablet Take 54 mg by mouth daily.    . fluticasone (FLONASE) 50 MCG/ACT nasal spray Place 2 sprays into both nostrils daily.    Marland Kitchen gabapentin (NEURONTIN) 600 MG tablet Take 600 mg by mouth 3 (three) times daily.    Marland Kitchen gemfibrozil (LOPID) 600 MG tablet gemfibrozil 600 mg tablet    . hydrochlorothiazide (HYDRODIURIL) 25 MG tablet Take 25 mg by mouth daily.    . irbesartan (AVAPRO) 150 MG tablet Take 150 mg by mouth daily.    Marland Kitchen neomycin-polymyxin-hydrocortisone (CORTISPORIN) OTIC solution 4 (four) times daily.    Marland Kitchen omeprazole (PRILOSEC) 20 MG capsule Take 20 mg by mouth daily.    . pregabalin (LYRICA) 25 MG capsule     . simvastatin (ZOCOR) 20 MG tablet Take 20 mg by mouth daily.    . traMADol (ULTRAM) 50 MG tablet tramadol 50 mg tablet    . venlafaxine (EFFEXOR) 75 MG tablet Take by mouth.     No current facility-administered medications on file prior to visit.     There are no Patient Instructions on file for this visit. No follow-ups on file.   Kris Hartmann, NP  This note was completed with Sales executive.  Any errors are purely unintentional.

## 2018-12-11 ENCOUNTER — Encounter (INDEPENDENT_AMBULATORY_CARE_PROVIDER_SITE_OTHER): Payer: Self-pay

## 2018-12-11 ENCOUNTER — Ambulatory Visit (INDEPENDENT_AMBULATORY_CARE_PROVIDER_SITE_OTHER): Payer: Medicare Other | Admitting: Nurse Practitioner

## 2018-12-11 DIAGNOSIS — I890 Lymphedema, not elsewhere classified: Secondary | ICD-10-CM | POA: Insufficient documentation

## 2018-12-11 NOTE — Progress Notes (Signed)
History of Present Illness  There is no documented history at this time  Assessments & Plan   There are no diagnoses linked to this encounter.    Additional instructions  Subjective:  Patient presents with venous ulcer of the Bilateral lower extremity.    Procedure:  3 layer unna wrap was placed Bilateral lower extremity.   Plan:   Follow up in one week.  

## 2018-12-18 ENCOUNTER — Other Ambulatory Visit: Payer: Self-pay

## 2018-12-18 ENCOUNTER — Encounter (INDEPENDENT_AMBULATORY_CARE_PROVIDER_SITE_OTHER): Payer: Self-pay

## 2018-12-18 ENCOUNTER — Ambulatory Visit (INDEPENDENT_AMBULATORY_CARE_PROVIDER_SITE_OTHER): Payer: Medicare Other | Admitting: Nurse Practitioner

## 2018-12-18 VITALS — BP 106/69 | HR 60 | Resp 18 | Ht 74.0 in | Wt 376.0 lb

## 2018-12-18 DIAGNOSIS — I890 Lymphedema, not elsewhere classified: Secondary | ICD-10-CM | POA: Diagnosis not present

## 2018-12-18 DIAGNOSIS — L97919 Non-pressure chronic ulcer of unspecified part of right lower leg with unspecified severity: Secondary | ICD-10-CM

## 2018-12-18 DIAGNOSIS — L97929 Non-pressure chronic ulcer of unspecified part of left lower leg with unspecified severity: Secondary | ICD-10-CM | POA: Diagnosis not present

## 2018-12-18 NOTE — Progress Notes (Signed)
History of Present Illness  There is no documented history at this time  Assessments & Plan   There are no diagnoses linked to this encounter.    Additional instructions  Subjective:  Patient presents with venous ulcer of the Bilateral lower extremity.    Procedure:  3 layer unna wrap was placed Bilateral lower extremity.   Plan:   Follow up in one week.  

## 2018-12-25 ENCOUNTER — Encounter (INDEPENDENT_AMBULATORY_CARE_PROVIDER_SITE_OTHER): Payer: Self-pay | Admitting: Nurse Practitioner

## 2018-12-25 ENCOUNTER — Other Ambulatory Visit: Payer: Self-pay

## 2018-12-25 ENCOUNTER — Ambulatory Visit (INDEPENDENT_AMBULATORY_CARE_PROVIDER_SITE_OTHER): Payer: Medicare Other | Admitting: Nurse Practitioner

## 2018-12-25 VITALS — BP 97/60 | HR 67 | Resp 16 | Ht 74.0 in | Wt 369.0 lb

## 2018-12-25 DIAGNOSIS — Z87891 Personal history of nicotine dependence: Secondary | ICD-10-CM

## 2018-12-25 DIAGNOSIS — I890 Lymphedema, not elsewhere classified: Secondary | ICD-10-CM | POA: Diagnosis not present

## 2018-12-25 DIAGNOSIS — E1159 Type 2 diabetes mellitus with other circulatory complications: Secondary | ICD-10-CM

## 2018-12-25 DIAGNOSIS — Z79899 Other long term (current) drug therapy: Secondary | ICD-10-CM

## 2018-12-25 DIAGNOSIS — I2510 Atherosclerotic heart disease of native coronary artery without angina pectoris: Secondary | ICD-10-CM

## 2018-12-25 DIAGNOSIS — K219 Gastro-esophageal reflux disease without esophagitis: Secondary | ICD-10-CM | POA: Insufficient documentation

## 2018-12-25 DIAGNOSIS — I10 Essential (primary) hypertension: Secondary | ICD-10-CM

## 2018-12-25 NOTE — Progress Notes (Signed)
SUBJECTIVE:  Patient ID: Derek Merritt, male    DOB: [birth year], 73 y.o.   MRN: [id number] Chief Complaint  Patient presents with  . Follow-up    unna check     HPI  Derek Merritt is a 73 y.o. male The patient returns to the office for followup evaluation regarding leg swelling.  The swelling has improved quite a bit and the pain associated with swelling has decreased substantially. There have not been any interval development of a ulcerations or wounds.  The patient has been utilizing his lymphedema pump as well.  He states that legs feel much better with Unna wrap therapy.  He does note that his urinary output has greatly increased.  The patient also states elevation during the day and exercise is being done too.  Patient denies any fever, chills, nausea, vomiting or diarrhea.  He denies any chest pain or shortness of breath.   Past Medical History:  Diagnosis Date  . Coronary artery disease   . GERD (gastroesophageal reflux disease)   . Headache    chronic  . Hypertension    accelerated hypertension with diastolic congestive heart failure    Past Surgical History:  Procedure Laterality Date  . anterior transposition of right ulnar nerve at the elbow    . APPENDECTOMY    . COLONOSCOPY    . COLONOSCOPY WITH PROPOFOL N/A 04/29/2017   Procedure: COLONOSCOPY WITH PROPOFOL;  Surgeon: Lollie Sails, MD;  Location: [facility] ENDOSCOPY;  Service: Endoscopy;  Laterality: N/A;  . CORONARY ARTERY BYPASS GRAFT     x4  . ESOPHAGOGASTRODUODENOSCOPY (EGD) WITH PROPOFOL N/A 04/29/2017   Procedure: ESOPHAGOGASTRODUODENOSCOPY (EGD) WITH PROPOFOL;  Surgeon: Lollie Sails, MD;  Location: [facility] ENDOSCOPY;  Service: Endoscopy;  Laterality: N/A;  . EYE SURGERY     cataract  . spinal fusion surgery      Social History   Socioeconomic History  . Marital status: Widowed    Spouse name: Not on file  . Number of children: Not on file  . Years of education: Not on file  . Highest  education level: Not on file  Occupational History  . Not on file  Social Needs  . Financial resource strain: Not on file  . Food insecurity:    Worry: Not on file    Inability: Not on file  . Transportation needs:    Medical: Not on file    Non-medical: Not on file  Tobacco Use  . Smoking status: Former Smoker    Years: 10.00    Types: Cigarettes  . Smokeless tobacco: Never Used  Substance and Sexual Activity  . Alcohol use: No  . Drug use: No  . Sexual activity: Not on file  Lifestyle  . Physical activity:    Days per week: Not on file    Minutes per session: Not on file  . Stress: Not on file  Relationships  . Social connections:    Talks on phone: Not on file    Gets together: Not on file    Attends religious service: Not on file    Active member of club or organization: Not on file    Attends meetings of clubs or organizations: Not on file    Relationship status: Not on file  . Intimate partner violence:    Fear of current or ex partner: Not on file    Emotionally abused: Not on file    Physically abused: Not on file    Forced sexual  activity: Not on file  Other Topics Concern  . Not on file  Social History Narrative  . Not on file    Family History  Problem Relation Age of Onset  . Heart attack Maternal Grandmother     Allergies  Allergen Reactions  . Ticagrelor Shortness Of Breath  . Lisinopril      Review of Systems   Review of Systems: Negative Unless Checked Constitutional: [] Weight loss  [] Fever  [] Chills Cardiac: [] Chest pain   []  Atrial Fibrillation  [] Palpitations   [] Shortness of breath when laying flat   [] Shortness of breath with exertion. [] Shortness of breath at rest Vascular:  [] Pain in legs with walking   [] Pain in legs with standing [] Pain in legs when laying flat   [] Claudication    [] Pain in feet when laying flat    [] History of DVT   [] Phlebitis   [x] Swelling in legs   [x] Varicose veins   [] Non-healing ulcers Pulmonary:   [] Uses  home oxygen   [] Productive cough   [] Hemoptysis   [] Wheeze  [] COPD   [] Asthma Neurologic:  [] Dizziness   [] Seizures  [] Blackouts [] History of stroke   [] History of TIA  [] Aphasia   [] Temporary Blindness   [] Weakness or numbness in arm   [] Weakness or numbness in leg Musculoskeletal:   [] Joint swelling   [] Joint pain   [] Low back pain  []  History of Knee Replacement [] Arthritis [] back Surgeries  []  Spinal Stenosis    Hematologic:  [] Easy bruising  [] Easy bleeding   [] Hypercoagulable state   [] Anemic Gastrointestinal:  [] Diarrhea   [] Vomiting  [] Gastroesophageal reflux/heartburn   [] Difficulty swallowing. [] Abdominal pain Genitourinary:  [] Chronic kidney disease   [] Difficult urination  [] Anuric   [] Blood in urine [] Frequent urination  [] Burning with urination   [] Hematuria Skin:  [] Rashes   [] Ulcers [] Wounds Psychological:  [] History of anxiety   []  History of major depression  []  Memory Difficulties      OBJECTIVE:   Physical Exam  BP 97/60 (BP Location: Right Arm)   Pulse 67   Resp 16   Ht 6\' 2"  (1.88 m)   Wt (!) 369 lb (167.4 kg)   BMI 47.38 kg/m   Gen: WD/WN, NAD Head: [place]/AT, No temporalis wasting.  Ear/Nose/Throat: Hearing grossly intact, nares w/o erythema or drainage Eyes: PER, EOMI, sclera nonicteric.  Neck: Supple, no masses.  No JVD.  Pulmonary:  Good air movement, no use of accessory muscles.  Cardiac: RRR Vascular: 2+ soft edema  Vessel Right Left  Radial Palpable Palpable  Dorsalis Pedis Palpable Palpable  Posterior Tibial Palpable Palpable   Gastrointestinal: soft, non-distended. No guarding/no peritoneal signs.  Musculoskeletal: M/S 5/5 throughout.  No deformity or atrophy.  Neurologic: Pain and light touch intact in extremities.  Symmetrical.  Speech is fluent. Motor exam as listed above. Psychiatric: Judgment intact, Mood & affect appropriate for pt's clinical situation. Dermatologic: No Venous rashes. No Ulcers Noted.  No changes consistent with cellulitis.  Lymph : No Cervical lymphadenopathy, no lichenification or skin changes of chronic lymphedema.       ASSESSMENT AND PLAN:  1. Lymphedema  No surgery or intervention at this point in time.    I have reviewed my discussion with the patient regarding lymphedema and why it  causes symptoms.  Patient will continue wearing graduated compression stockings class 1 (20-30 mmHg) on a daily basis a prescription was given. The patient is reminded to put the stockings on first thing in the morning and removing them in the evening.  The patient is instructed specifically not to sleep in the stockings.   In addition, behavioral modification throughout the day will be continued.  This will include frequent elevation (such as in a recliner), use of over the counter pain medications as needed and exercise such as walking.  I have reviewed systemic causes for chronic edema such as liver, kidney and cardiac etiologies and there does not appear to be any significant changes in these organ systems over the past year.  The patient is under the impression that these organ systems are all stable and unchanged.    The patient will continue aggressive use of the  lymph pump.  This will continue to improve the edema control and prevent sequela such as ulcers and infections.   The patient will follow-up with me in 6 month.    2. Hypertension associated with type 2 diabetes mellitus ([place]) Continue antihypertensive medications as already ordered, these medications have been reviewed and there are no changes at this time.   3. Gastroesophageal reflux disease, esophagitis presence not specified Continue PPI as already ordered, this medication has been reviewed and there are no changes at this time.  Avoidence of caffeine and alcohol  Moderate elevation of the head of the bed   4. Coronary arteriosclerosis Continue cardiac and antihypertensive medications as already ordered and reviewed, no changes at this time.   Continue statin as ordered and reviewed, no changes at this time  Nitrates PRN for chest pain    Current Outpatient Medications on File Prior to Visit  Medication Sig Dispense Refill  . aspirin 81 MG tablet Take 81 mg by mouth daily.     Marland Kitchen atorvastatin (LIPITOR) 80 MG tablet Take by mouth.    . botulinum toxin Type A (BOTOX) 100 units SOLR injection Inject 100 Units into the muscle once.    . Cholecalciferol (VITAMIN D-1000 MAX ST) 25 MCG (1000 UT) tablet Take by mouth.    . clopidogrel (PLAVIX) 75 MG tablet Take by mouth.    . diclofenac (VOLTAREN) 75 MG EC tablet Take 75 mg by mouth 2 (two) times daily.    Marland Kitchen doxycycline (VIBRAMYCIN) 100 MG capsule Take 1 capsule (100 mg total) by mouth 2 (two) times daily. 10 capsule 0  . famotidine (PEPCID) 40 MG tablet Take 40 mg by mouth daily.    . fenofibrate 54 MG tablet Take 54 mg by mouth daily.    . fluticasone (FLONASE) 50 MCG/ACT nasal spray Place 2 sprays into both nostrils daily.    . furosemide (LASIX) 40 MG tablet Take 40 mg by mouth daily as needed.    . gabapentin (NEURONTIN) 600 MG tablet Take 600 mg by mouth 3 (three) times daily.    Marland Kitchen gemfibrozil (LOPID) 600 MG tablet gemfibrozil 600 mg tablet    . hydrochlorothiazide (HYDRODIURIL) 25 MG tablet Take 25 mg by mouth daily.    . irbesartan (AVAPRO) 150 MG tablet Take 150 mg by mouth daily.    . isosorbide mononitrate (IMDUR) 60 MG 24 hr tablet Take by mouth.    . losartan (COZAAR) 50 MG tablet Take 50 mg by mouth daily.    . metFORMIN (GLUCOPHAGE) 500 MG tablet Take by mouth 2 (two) times daily with a meal.    . metoprolol succinate (TOPROL-XL) 50 MG 24 hr tablet Take by mouth.    . Multiple Vitamin (MULTIVITAMIN) tablet Take 1 tablet by mouth daily.    Marland Kitchen neomycin-polymyxin-hydrocortisone (CORTISPORIN) OTIC solution 4 (four) times daily.    Marland Kitchen  nitroGLYCERIN (NITROSTAT) 0.4 MG SL tablet Place 0.4 mg under the tongue every 5 (five) minutes as needed for chest pain.    Marland Kitchen nystatin  (MYCOSTATIN/NYSTOP) powder Apply topically.    Marland Kitchen omeprazole (PRILOSEC) 20 MG capsule Take 20 mg by mouth daily.    . pantoprazole (PROTONIX) 40 MG tablet Take by mouth.    . pregabalin (LYRICA) 25 MG capsule     . simvastatin (ZOCOR) 20 MG tablet Take 20 mg by mouth daily.    Marland Kitchen spironolactone (ALDACTONE) 25 MG tablet Take by mouth.    . traMADol (ULTRAM) 50 MG tablet tramadol 50 mg tablet    . venlafaxine (EFFEXOR) 75 MG tablet Take by mouth.    . vitamin B-12 (CYANOCOBALAMIN) 1000 MCG tablet Take by mouth.     No current facility-administered medications on file prior to visit.     There are no Patient Instructions on file for this visit. No follow-ups on file.   Kris Hartmann, NP  This note was completed with Sales executive.  Any errors are purely unintentional.

## 2019-04-07 ENCOUNTER — Other Ambulatory Visit: Payer: Self-pay | Admitting: Nurse Practitioner

## 2019-04-07 DIAGNOSIS — R1012 Left upper quadrant pain: Secondary | ICD-10-CM

## 2019-04-13 ENCOUNTER — Other Ambulatory Visit: Payer: Self-pay

## 2019-04-13 ENCOUNTER — Ambulatory Visit
Admission: RE | Admit: 2019-04-13 | Discharge: 2019-04-13 | Disposition: A | Discharge status: Home / Self Care | Payer: Medicare Other | Source: Ambulatory Visit | Attending: Nurse Practitioner | Admitting: Nurse Practitioner

## 2019-04-13 DIAGNOSIS — R1012 Left upper quadrant pain: Secondary | ICD-10-CM | POA: Diagnosis not present

## 2019-04-13 HISTORY — DX: Type 2 diabetes mellitus without complications: E11.9

## 2019-04-13 HISTORY — DX: Heart failure, unspecified: I50.9

## 2019-04-13 MED ORDER — IOHEXOL 300 MG/ML  SOLN
100.0000 mL | Freq: Once | INTRAMUSCULAR | Status: AC | PRN
  Administered 2019-04-13: 100 mL via INTRAVENOUS

## 2019-05-18 IMAGING — US US ABDOMEN COMPLETE
1 series · 13 of 25 positions shown · non-contrast
Comparison: No prior abdominal ultrasound.

CLINICAL DATA: LEFT UPPER QUADRANT postprandial abdominal pain.
Known cholelithiasis.

EXAM:
ABDOMEN ULTRASOUND COMPLETE

[Series 1: us abdomen complete · 0.27mm/px · 13 of 94 slices shown]
[im 1/94]
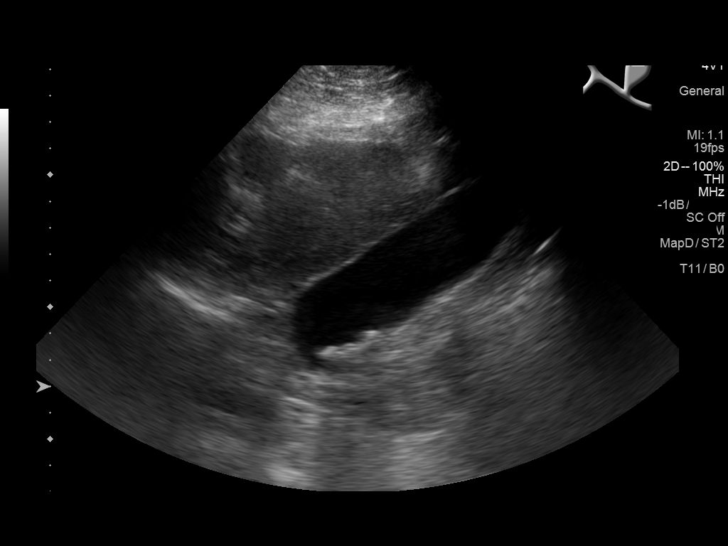
[im 8/94]
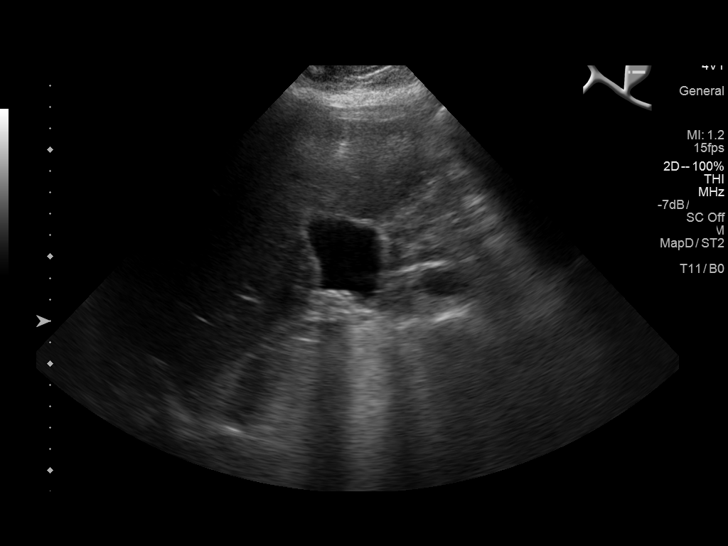
[im 16/94]
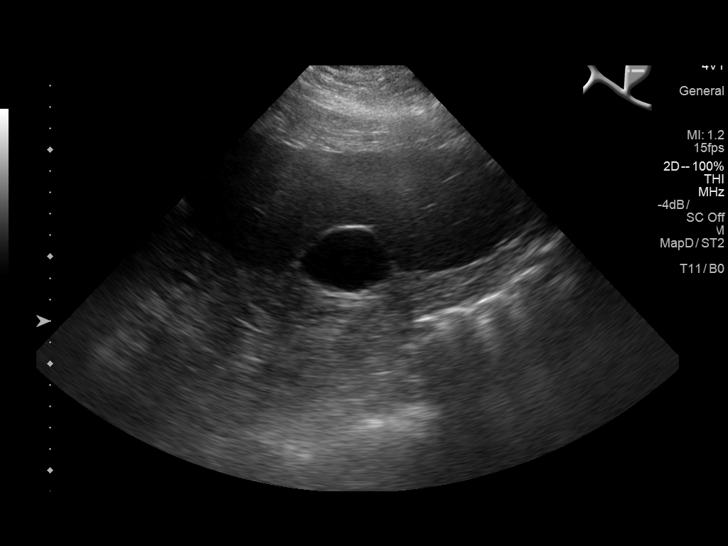
[im 24/94]
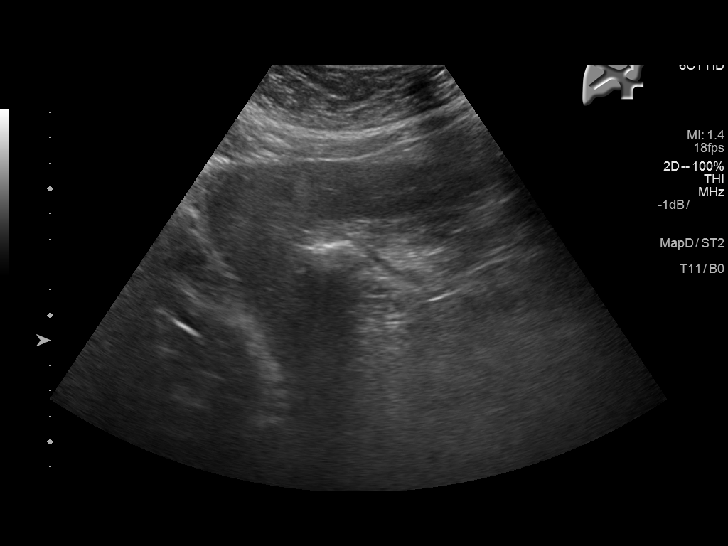
[im 32/94]
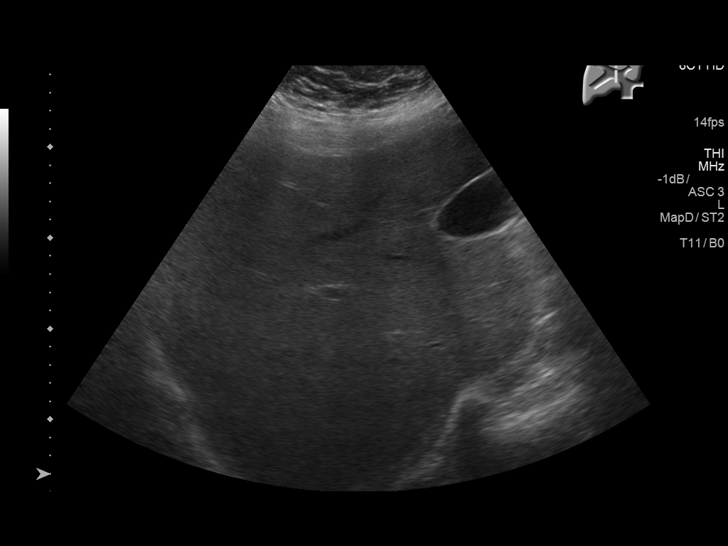
[im 39/94]
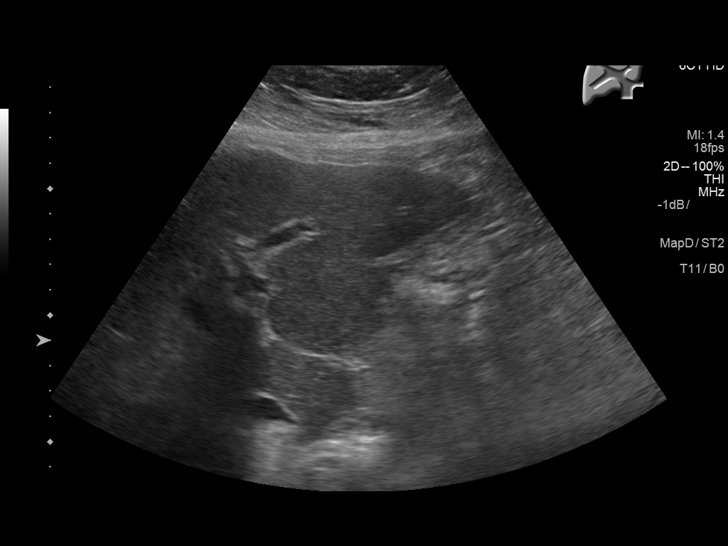
[im 47/94]
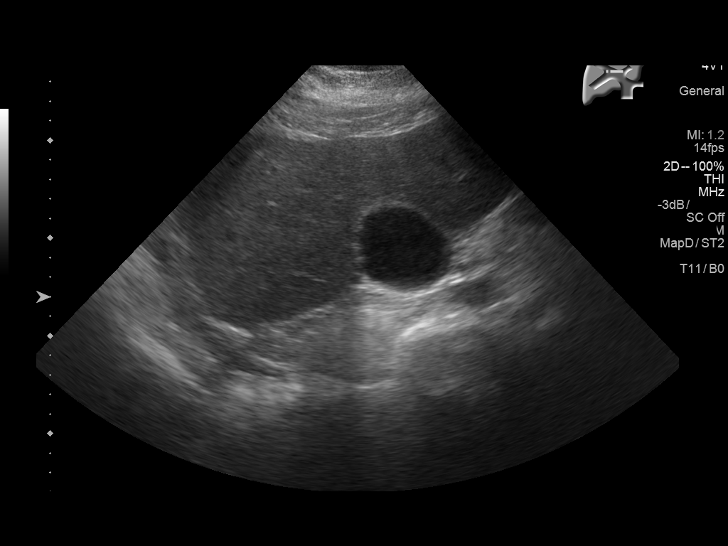
[im 55/94]
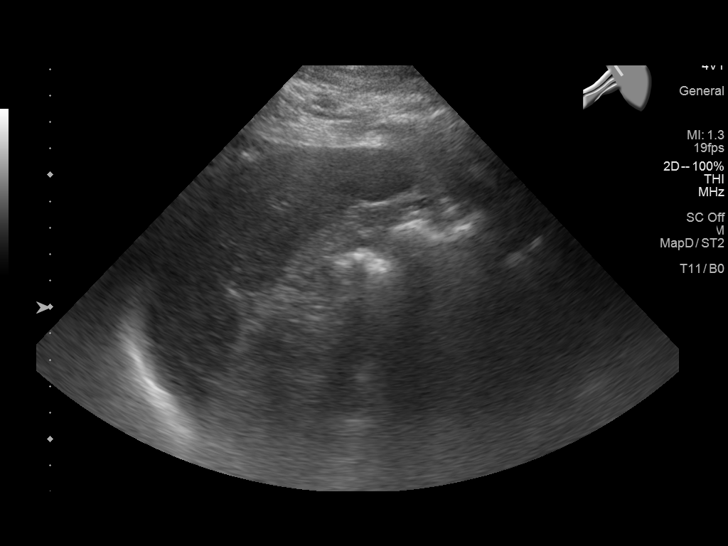
[im 63/94]
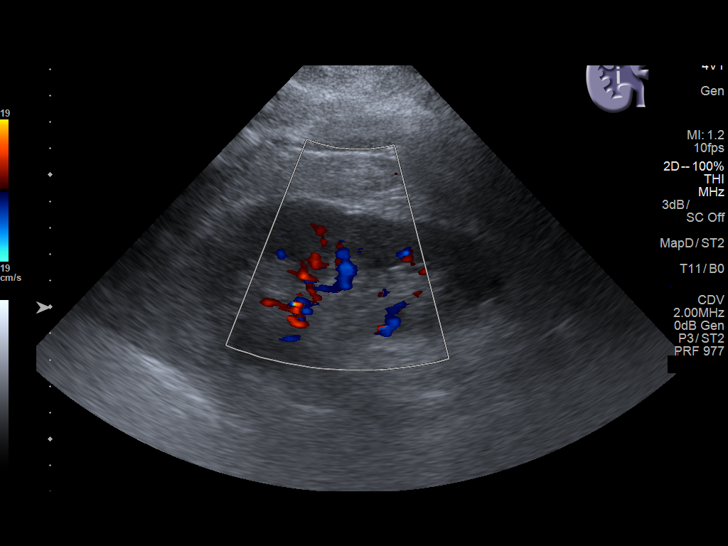
[im 70/94]
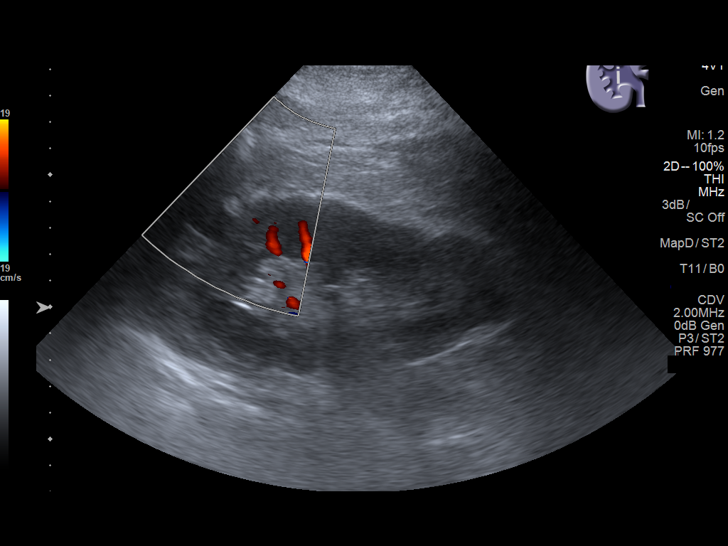
[im 78/94]
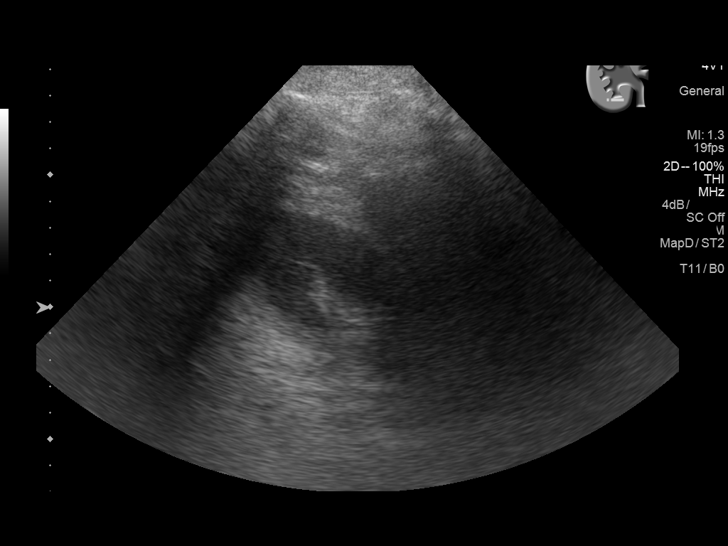
[im 86/94]
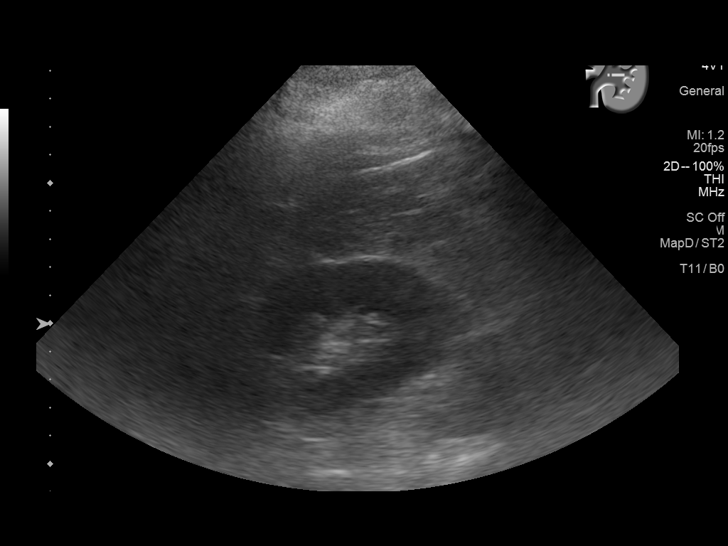
[im 94/94]
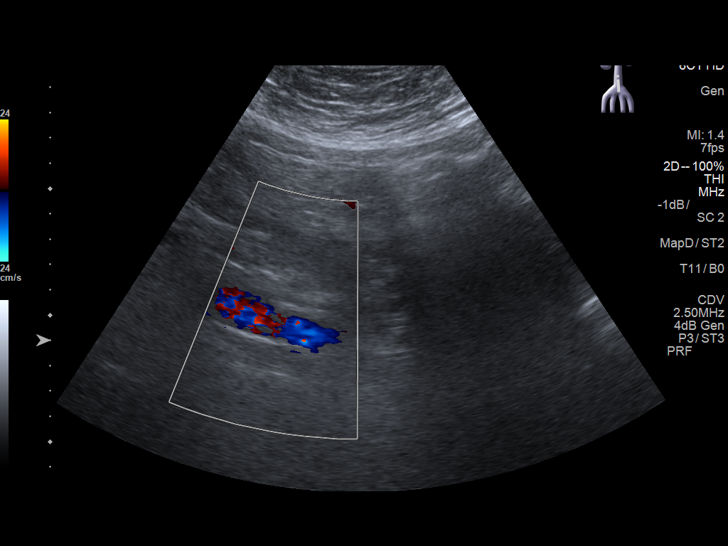

[13 of 25 positions shown; findings below may reference images not displayed]

CT abdomen and pelvis
07/03/2017 and earlier. Urinary tract ultrasound 08/18/2014.
FINDINGS: Gallbladder: Numerous small mobile shadowing gallstones, the largest
on the order of 1.5 cm. No gallbladder wall thickening or
pericholecystic fluid. Negative sonographic Murphy's sign according
to the ultrasound technologist.

Common bile duct: Diameter: Approximately 4 mm.

Liver: Prominent Riedel's lobe as noted on the prior CTs. Geographic
areas of increased and coarsened echotexture. No hepatic masses..
Portal vein is patent on color Doppler imaging with normal direction
of blood flow towards the liver.

IVC: Patent.

Pancreas: While difficult to visualize in its entirety, visualized
portions normal in appearance.

Spleen: Normal size and echotexture without focal parenchymal
abnormality.

Right Kidney: Length: Approximately 12.4 cm. No hydronephrosis.
Well-preserved cortex. No shadowing calculi. Normal parenchymal
echotexture. Exophytic approximate 1.6 cm benign cyst arising from
the UPPER pole as noted on the prior CTs. No solid mass.

Left Kidney: Length: Approximately 12.6 cm. No hydronephrosis.
Well-preserved cortex. No shadowing calculi. Normal parenchymal
echotexture. No focal parenchymal abnormality.

Abdominal aorta: Partially obscured by midline bowel gas. Normal in
caliber where visualized, maximum diameter 1.9 cm. Moderate
atherosclerosis.

Other findings: None.
IMPRESSION: 1. Cholelithiasis without sonographic evidence of acute
cholecystitis.
2. Geographic areas of focal hepatic steatosis.  No hepatic masses.
3. Benign cyst arising from the UPPER pole the RIGHT kidney.
4.  Aortic Atherosclerosis ([id number]-[id number])

## 2019-06-29 ENCOUNTER — Ambulatory Visit (INDEPENDENT_AMBULATORY_CARE_PROVIDER_SITE_OTHER): Payer: Medicare Other | Admitting: Nurse Practitioner

## 2019-09-24 IMAGING — CT CT RENAL STONE PROTOCOL
3 of 5 series · 16 of 46 positions shown, 18 images · non-contrast
Comparison: 08/25/2009

CLINICAL DATA: Right flank pain for several days, history of renal
calculi

EXAM:
CT ABDOMEN AND PELVIS WITHOUT CONTRAST
TECHNIQUE: Multidetector CT imaging of the abdomen and pelvis was performed
following the standard protocol without IV contrast.

[Series 2: stone full standard · axial · 0.98mm/px · z∈[-505,-85]mm · 12 of 100 slices shown, 14 images]
[im 8/100  soft-tissue]
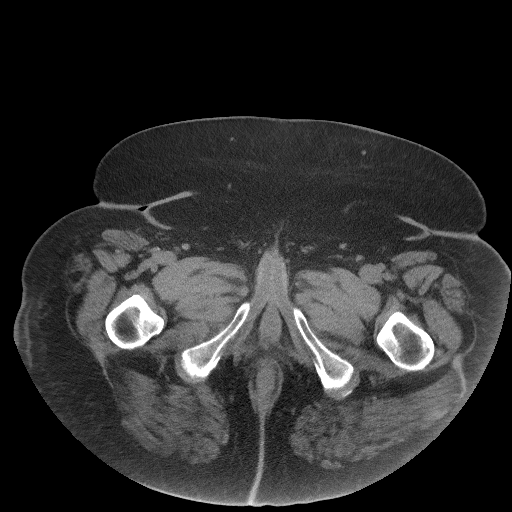
[im 8/100  bone]
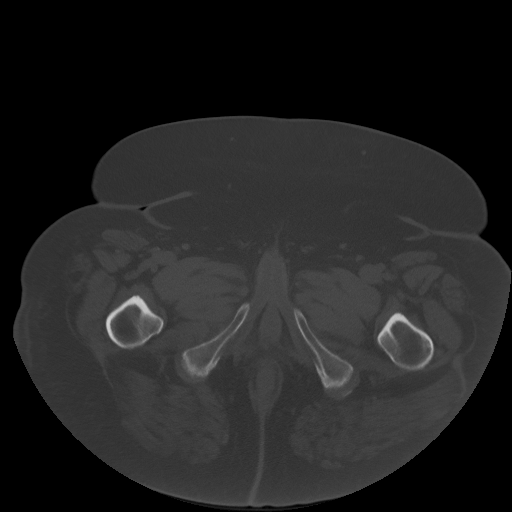
[im 16/100  soft-tissue]
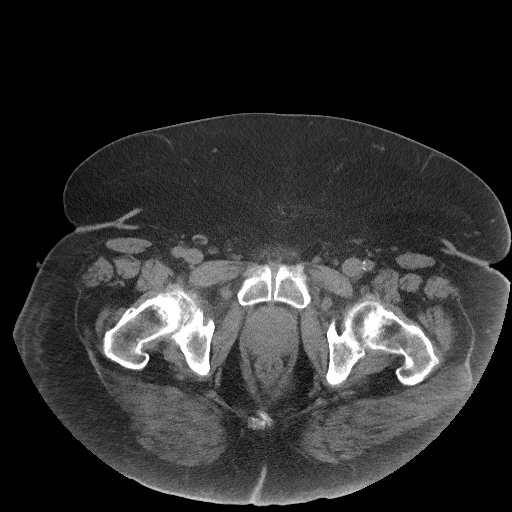
[im 23/100  soft-tissue]
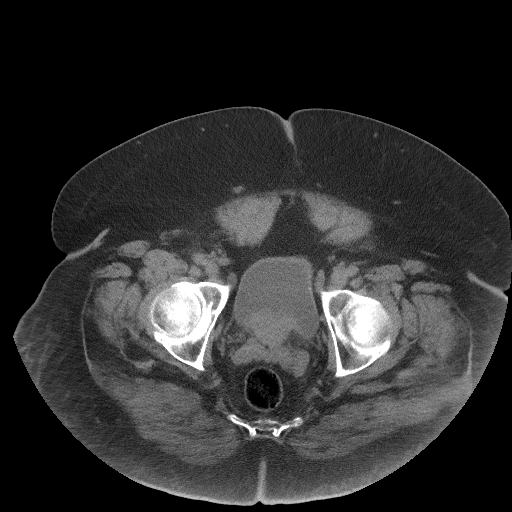
[im 31/100  soft-tissue]
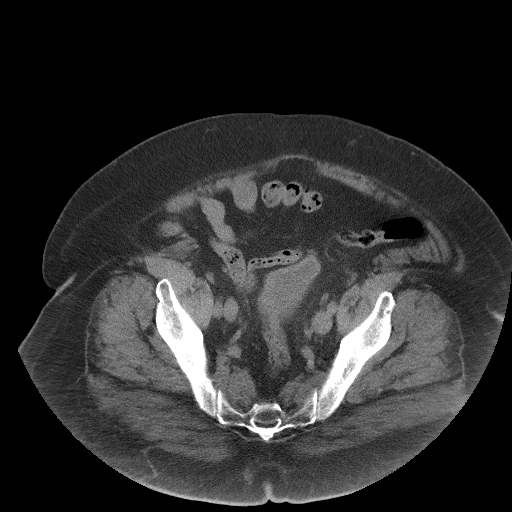
[im 39/100  soft-tissue]
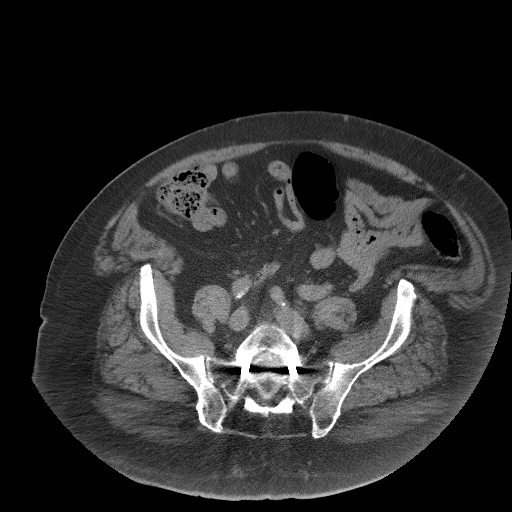
[im 46/100  soft-tissue]
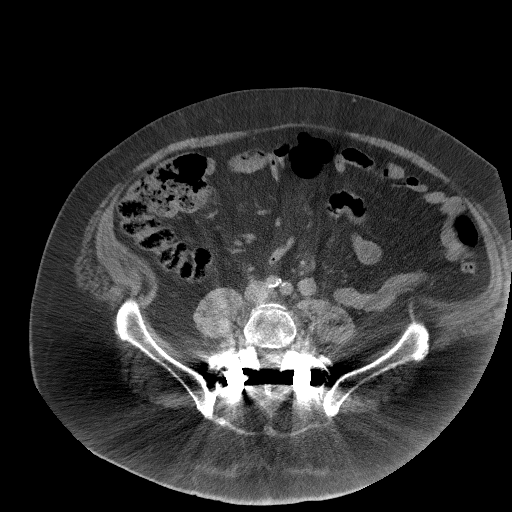
[im 54/100  soft-tissue]
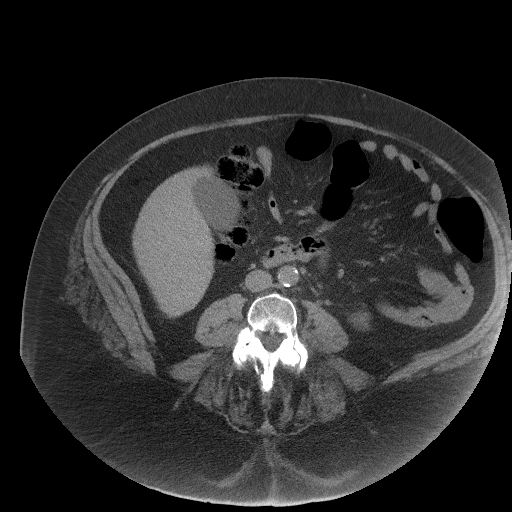
[im 61/100  soft-tissue]
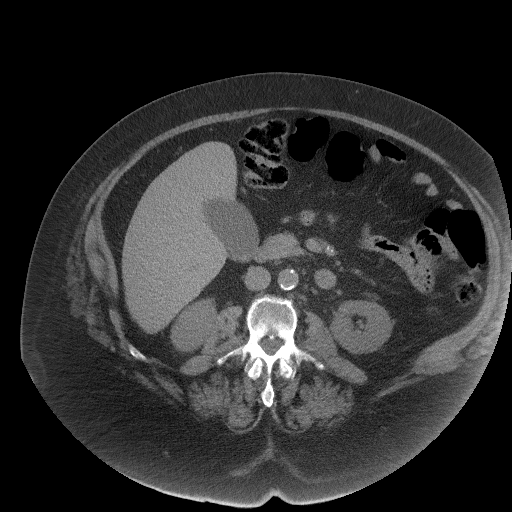
[im 69/100  soft-tissue]
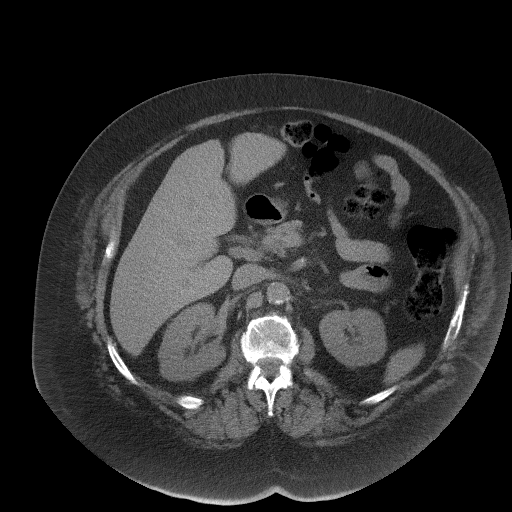
[im 69/100  bone]
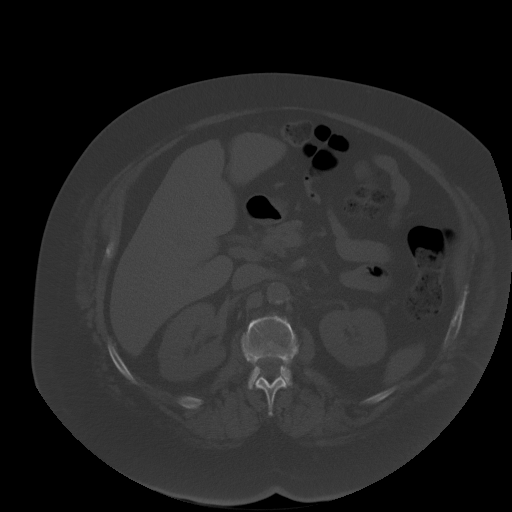
[im 77/100  soft-tissue]
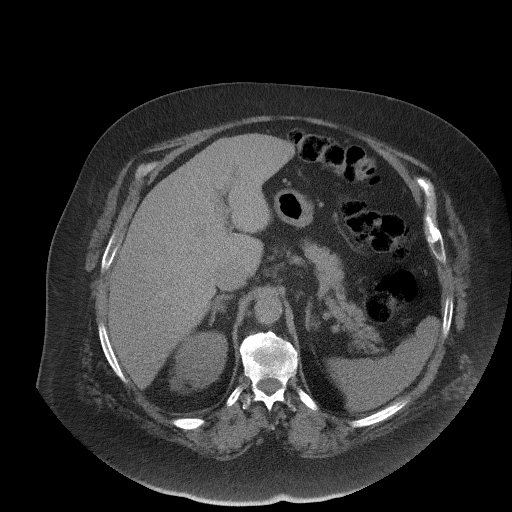
[im 84/100  soft-tissue]
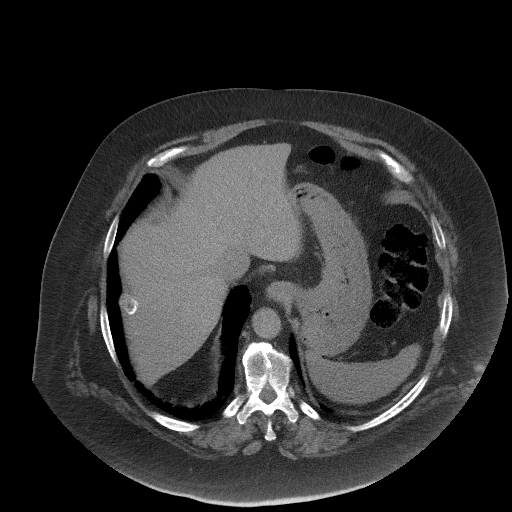
[im 92/100  soft-tissue]
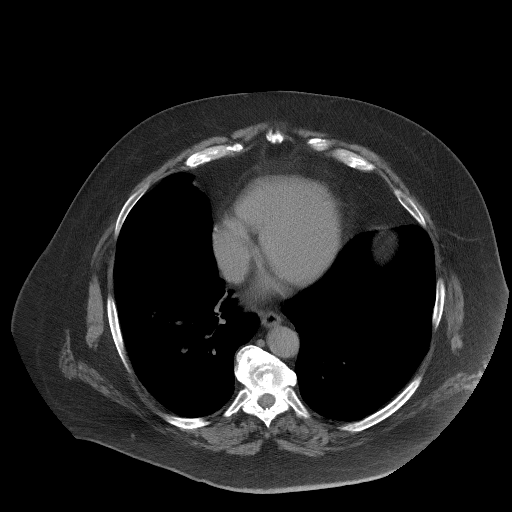

[Series 4: lung bases · axial · 0.98mm/px · 1 of 32 slices shown]
[im 11/32  bone]
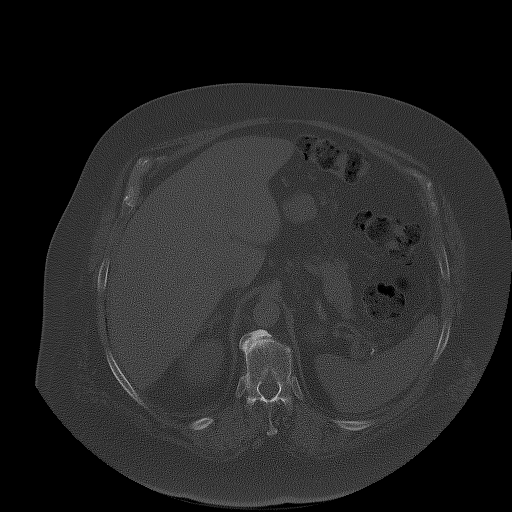

[Series 5: coronal · coronal · 1.01mm/px · 3 of 203 slices shown]
[im 68/203  soft-tissue]
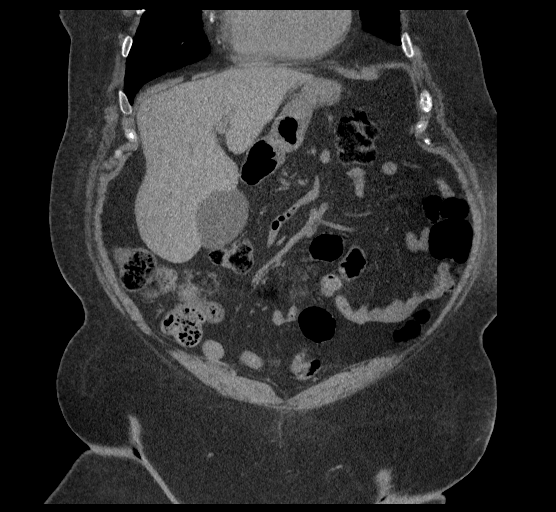
[im 90/203  soft-tissue]
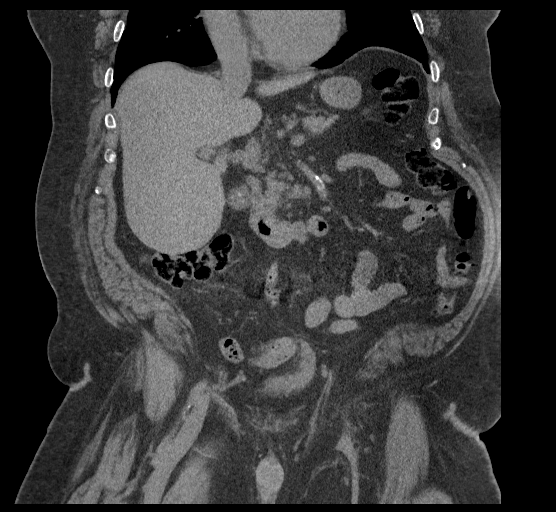
[im 113/203  soft-tissue]
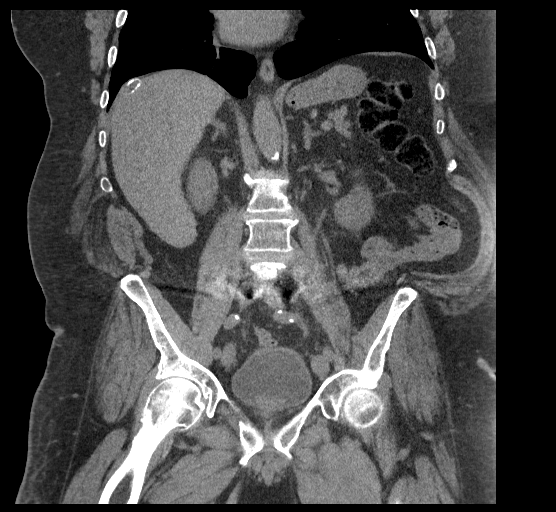

[16 of 46 positions shown; findings below may reference images not displayed]

FINDINGS: Lower chest: Minimal emphysematous changes are noted. No acute
abnormality is seen

Hepatobiliary: Stable peripherally calcified hypodensity in the
lateral aspect of the right lobe of the liver is noted. The
gallbladder demonstrates some dependent density likely related to
small stones. This is stable from the prior exam.

Pancreas: Unremarkable. No pancreatic ductal dilatation or
surrounding inflammatory changes.

Spleen: Normal in size without focal abnormality.

Adrenals/Urinary Tract: The adrenal glands are within normal limits.
An exophytic hypodensity measuring 1 cm noted arising from the upper
pole of the right kidney consistent with small cysts. No renal
calculi are noted. No obstructive changes are seen. The ureters are
within normal limits bilaterally. The bladder is partially
decompressed.

Stomach/Bowel: Changes of prior appendectomy are noted. Mild
diverticular changes noted without evidence of diverticulitis. No
obstructive or inflammatory changes are seen.

Vascular/Lymphatic: Aortic atherosclerosis. No enlarged abdominal or
pelvic lymph nodes.

Reproductive: Prostate is unremarkable.

Other: No abdominal wall hernia or abnormality. No abdominopelvic
ascites.

Musculoskeletal: Degenerative changes of the lumbar spine are noted.
IMPRESSION: No evidence of renal calculi or urinary tract obstructive changes.

Cholelithiasis without complicating factors.

Chronic changes stable from the previous exam.
